# Patient Record
Sex: Female | Born: 1995 | Race: White | Hispanic: No | Marital: Married | Smoking: Never smoker
Health system: Southern US, Community
[De-identification: ages and names within clinical notes are randomized; demographics above are authoritative.]

## PROBLEM LIST (undated history)

## (undated) DIAGNOSIS — O24419 Gestational diabetes mellitus in pregnancy, unspecified control: Secondary | ICD-10-CM

## (undated) DIAGNOSIS — K469 Unspecified abdominal hernia without obstruction or gangrene: Secondary | ICD-10-CM

## (undated) HISTORY — DX: Unspecified abdominal hernia without obstruction or gangrene: K46.9

## (undated) HISTORY — PX: TYMPANOSTOMY TUBE PLACEMENT: SHX32

## (undated) HISTORY — DX: Gestational diabetes mellitus in pregnancy, unspecified control: O24.419

## (undated) HISTORY — PX: ADENOIDECTOMY: SUR15

---

## 2017-07-17 ENCOUNTER — Other Ambulatory Visit: Payer: Self-pay

## 2017-07-17 ENCOUNTER — Encounter: Payer: Self-pay | Admitting: Obstetrics and Gynecology

## 2017-07-17 ENCOUNTER — Other Ambulatory Visit (HOSPITAL_COMMUNITY)
Admission: RE | Admit: 2017-07-17 | Discharge: 2017-07-17 | Disposition: A | Discharge status: Home / Self Care | Payer: BC Managed Care – PPO | Source: Ambulatory Visit | Attending: Obstetrics and Gynecology | Admitting: Obstetrics and Gynecology

## 2017-07-17 ENCOUNTER — Ambulatory Visit: Payer: BC Managed Care – PPO | Admitting: Obstetrics and Gynecology

## 2017-07-17 VITALS — BP 124/78 | HR 80 | Resp 12 | Ht 65.75 in | Wt 153.0 lb

## 2017-07-17 DIAGNOSIS — Z124 Encounter for screening for malignant neoplasm of cervix: Secondary | ICD-10-CM | POA: Diagnosis not present

## 2017-07-17 DIAGNOSIS — Z7189 Other specified counseling: Secondary | ICD-10-CM

## 2017-07-17 DIAGNOSIS — Z01419 Encounter for gynecological examination (general) (routine) without abnormal findings: Secondary | ICD-10-CM

## 2017-07-17 DIAGNOSIS — N946 Dysmenorrhea, unspecified: Secondary | ICD-10-CM

## 2017-07-17 DIAGNOSIS — Z3041 Encounter for surveillance of contraceptive pills: Secondary | ICD-10-CM

## 2017-07-17 DIAGNOSIS — Z7185 Encounter for immunization safety counseling: Secondary | ICD-10-CM

## 2017-07-17 DIAGNOSIS — Z23 Encounter for immunization: Secondary | ICD-10-CM

## 2017-07-17 MED ORDER — SPRINTEC 28 0.25-35 MG-MCG PO TABS: 1.0000 | ORAL_TABLET | Freq: Every day | ORAL | 3 refills | Status: DC

## 2017-07-17 MED ORDER — NAPROXEN SODIUM 550 MG PO TABS: 550.0000 mg | ORAL_TABLET | Freq: Two times a day (BID) | ORAL | 1 refills | Status: DC

## 2017-07-17 NOTE — Patient Instructions (Addendum)
Breast Self-Awareness Breast self-awareness means being familiar with how your breasts look and feel. It involves checking your breasts regularly and reporting any changes to your health care provider. Practicing breast self-awareness is important. A change in your breasts can be a sign of a serious medical problem. Being familiar with how your breasts look and feel allows you to find any problems early, when treatment is more likely to be successful. All women should practice breast self-awareness, including women who have had breast implants. How to do a breast self-exam One way to learn what is normal for your breasts and whether your breasts are changing is to do a breast self-exam. To do a breast self-exam: Look for Changes  1. Remove all the clothing above your waist. 2. Stand in front of a mirror in a room with good lighting. 3. Put your hands on your hips. 4. Push your hands firmly downward. 5. Compare your breasts in the mirror. Look for differences between them (asymmetry), such as: ? Differences in shape. ? Differences in size. ? Puckers, dips, and bumps in one breast and not the other. 6. Look at each breast for changes in your skin, such as: ? Redness. ? Scaly areas. 7. Look for changes in your nipples, such as: ? Discharge. ? Bleeding. ? Dimpling. ? Redness. ? A change in position. Feel for Changes  Carefully feel your breasts for lumps and changes. It is best to do this while lying on your back on the floor and again while sitting or standing in the shower or tub with soapy water on your skin. Feel each breast in the following way:  Place the arm on the side of the breast you are examining above your head.  Feel your breast with the other hand.  Start in the nipple area and make  inch (2 cm) overlapping circles to feel your breast. Use the pads of your three middle fingers to do this. Apply light pressure, then medium pressure, then firm pressure. The light pressure  will allow you to feel the tissue closest to the skin. The medium pressure will allow you to feel the tissue that is a little deeper. The firm pressure will allow you to feel the tissue close to the ribs.  Continue the overlapping circles, moving downward over the breast until you feel your ribs below your breast.  Move one finger-width toward the center of the body. Continue to use the  inch (2 cm) overlapping circles to feel your breast as you move slowly up toward your collarbone.  Continue the up and down exam using all three pressures until you reach your armpit.  Write Down What You Find  Write down what is normal for each breast and any changes that you find. Keep a written record with breast changes or normal findings for each breast. By writing this information down, you do not need to depend only on memory for size, tenderness, or location. Write down where you are in your menstrual cycle, if you are still menstruating. If you are having trouble noticing differences in your breasts, do not get discouraged. With time you will become more familiar with the variations in your breasts and more comfortable with the exam. How often should I examine my breasts? Examine your breasts every month. If you are breastfeeding, the best time to examine your breasts is after a feeding or after using a breast pump. If you menstruate, the best time to examine your breasts is 5-7 days after your  period is over. During your period, your breasts are lumpier, and it may be more difficult to notice changes. When should I see my health care provider? See your health care provider if you notice:  A change in shape or size of your breasts or nipples.  A change in the skin of your breast or nipples, such as a reddened or scaly area.  Unusual discharge from your nipples.  A lump or thick area that was not there before.  Pain in your breasts.  Anything that concerns you.  This information is not intended  to replace advice given to you by your health care provider. Make sure you discuss any questions you have with your health care provider. Document Released: 06/19/2005 Document Revised: 11/25/2015 Document Reviewed: 05/09/2015 Elsevier Interactive Patient Education  2018 [employer] Oral Contraception Information Oral contraceptive pills (OCPs) are medicines taken to prevent pregnancy. OCPs work by preventing the ovaries from releasing eggs. The hormones in OCPs also cause the cervical mucus to thicken, preventing the sperm from entering the uterus. The hormones also cause the uterine lining to become thin, not allowing a fertilized egg to attach to the inside of the uterus. OCPs are highly effective when taken exactly as prescribed. However, OCPs do not prevent sexually transmitted diseases (STDs). Safe sex practices, such as using condoms along with the pill, can help prevent STDs. Before taking the pill, you may have a physical exam and Pap test. Your health care provider may order blood tests. The health care provider will make sure you are a good candidate for oral contraception. Discuss with your health care provider the possible side effects of the OCP you may be prescribed. When starting an OCP, it can take 2 to 3 months for the body to adjust to the changes in hormone levels in your body. Types of oral contraception  The combination pill-This pill contains estrogen and progestin (synthetic progesterone) hormones. The combination pill comes in 21-day, 28-day, or 91-day packs. Some types of combination pills are meant to be taken continuously (365-day pills). With 21-day packs, you do not take pills for 7 days after the last pill. With 28-day packs, the pill is taken every day. The last 7 pills are without hormones. Certain types of pills have more than 21 hormone-containing pills. With 91-day packs, the first 84 pills contain both hormones, and the last 7 pills contain no hormones or contain  estrogen only.  The minipill-This pill contains the progesterone hormone only. The pill is taken every day continuously. It is very important to take the pill at the same time each day. The minipill comes in packs of 28 pills. All 28 pills contain the hormone. Advantages of oral contraceptive pills  Decreases premenstrual symptoms.  Treats menstrual period cramps.  Regulates the menstrual cycle.  Decreases a heavy menstrual flow.  May treatacne, depending on the type of pill.  Treats abnormal uterine bleeding.  Treats polycystic ovarian syndrome.  Treats endometriosis.  Can be used as emergency contraception. Things that can make oral contraceptive pills less effective OCPs can be less effective if:  You forget to take the pill at the same time every day.  You have a stomach or intestinal disease that lessens the absorption of the pill.  You take OCPs with other medicines that make OCPs less effective, such as antibiotics, certain HIV medicines, and some seizure medicines.  You take expired OCPs.  You forget to restart the pill on day 7, when using the packs of  21 pills.  Risks associated with oral contraceptive pills Oral contraceptive pills can sometimes cause side effects, such as:  Headache.  Nausea.  Breast tenderness.  Irregular bleeding or spotting.  Combination pills are also associated with a small increased risk of:  Blood clots.  Heart attack.  Stroke.  This information is not intended to replace advice given to you by your health care provider. Make sure you discuss any questions you have with your health care provider. Document Released: 09/09/2002 Document Revised: 11/25/2015 Document Reviewed: 12/08/2012 Elsevier Interactive Patient Education  2018 [employer]  EXERCISE AND DIET:  We recommended that you start or continue a regular exercise program for good health. Regular exercise means any activity that makes your heart beat faster and  makes you sweat.  We recommend exercising at least 30 minutes per day at least 3 days a week, preferably 4 or 5.  We also recommend a diet low in fat and sugar.  Inactivity, poor dietary choices and obesity can cause diabetes, heart attack, stroke, and kidney damage, among others.    ALCOHOL AND SMOKING:  Women should limit their alcohol intake to no more than 7 drinks/beers/glasses of wine (combined, not each!) per week. Moderation of alcohol intake to this level decreases your risk of breast cancer and liver damage. And of course, no recreational drugs are part of a healthy lifestyle.  And absolutely no smoking or even second hand smoke. Most people know smoking can cause heart and lung diseases, but did you know it also contributes to weakening of your bones? Aging of your skin?  Yellowing of your teeth and nails?  CALCIUM AND VITAMIN D:  Adequate intake of calcium and Vitamin D are recommended.  The recommendations for exact amounts of these supplements seem to change often, but generally speaking 600 mg of calcium (either carbonate or citrate) and 800 units of Vitamin D per day seems prudent. Certain women may benefit from higher intake of Vitamin D.  If you are among these women, your doctor will have told you during your visit.    PAP SMEARS:  Pap smears, to check for cervical cancer or precancers,  have traditionally been done yearly, although recent scientific advances have shown that most women can have pap smears less often.  However, every woman still should have a physical exam from her gynecologist every year. It will include a breast check, inspection of the vulva and vagina to check for abnormal growths or skin changes, a visual exam of the cervix, and then an exam to evaluate the size and shape of the uterus and ovaries.  And after 23 years of age, a rectal exam is indicated to check for rectal cancers. We will also provide age appropriate advice regarding health maintenance, like when you  should have certain vaccines, screening for sexually transmitted diseases, bone density testing, colonoscopy, mammograms, etc.

## 2017-07-17 NOTE — Progress Notes (Signed)
22 y.o. G0P0000 SingleCaucasianF here for annual exam.  She is living with her partner of 4 years. She moved here a year ago, loves it here. No dyspareunia.  Period Cycle (Days): 28 Period Duration (Days): 5 - 6 days  Period Pattern: Regular Menstrual Flow: Moderate Menstrual Control: Tampon Menstrual Control Change Freq (Hours): changes tampon every 3-4 hours  Dysmenorrhea: (!) Moderate Dysmenorrhea Symptoms: Cramping  No LMP recorded.          Sexually active: Yes.    The current method of family planning is OCP (estrogen/progesterone).    Exercising: Yes.    cardio/ lifting/swimming Smoker:  no  Health Maintenance: Pap:  Never TDaP:  2014  Gardasil: no    reports that  has never smoked. she has never used smokeless tobacco. She reports that she drinks about 1.2 oz of alcohol per week. She reports that she does not use drugs. 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   Past Medical History:  Diagnosis Date  . Hernia of abdominal cavity   Has a hernia above her umbilicus. Occasional discomfort in that area.   Past Surgical History:  Procedure Laterality Date  . ADENOIDECTOMY    . TYMPANOSTOMY TUBE PLACEMENT      Current Outpatient Medications  Medication Sig Dispense Refill  . SPRINTEC 28 0.25-35 MG-MCG tablet      No current facility-administered medications for this visit.     History reviewed. No pertinent family history.  Review of Systems  Constitutional: Negative.   HENT: Negative.   Eyes: Negative.   Respiratory: Negative.   Cardiovascular: Negative.   Gastrointestinal: Negative.   Endocrine: Negative.   Genitourinary: Negative.   Musculoskeletal: Negative.   Skin: Negative.   Allergic/Immunologic: Negative.   Neurological: Negative.   Psychiatric/Behavioral: Negative.     Exam:   BP 124/78 (BP Location: Right Arm, Patient Position: Sitting, Cuff Size: Normal)   Pulse 80   Resp 12   Ht 5' 5.75" (1.67 m)   Wt 153 lb (69.4 kg)    BMI 24.88 kg/m   Weight change: @WEIGHTCHANGE @ Height:   Height: 5' 5.75" (167 cm)  Ht Readings from Last 3 Encounters:  07/17/17 5' 5.75" (1.67 m)    General appearance: alert, cooperative and appears stated age Head: Normocephalic, without obvious abnormality, atraumatic Neck: no adenopathy, supple, symmetrical, trachea midline and thyroid normal to inspection and palpation Lungs: clear to auscultation bilaterally Cardiovascular: regular rate and rhythm Breasts: normal appearance, no masses or tenderness, accesorry nipple on the right Abdomen: soft, non-tender; non distended,  no masses,  no organomegaly. Just above the umbilicus is a very slight separation of the fascia.  Extremities: extremities normal, atraumatic, no cyanosis or edema Skin: Skin color, texture, turgor normal. No rashes or lesions Lymph nodes: Cervical, supraclavicular, and axillary nodes normal. No abnormal inguinal nodes palpated Neurologic: Grossly normal   Pelvic: External genitalia:  no lesions              Urethra:  normal appearing urethra with no masses, tenderness or lesions              Bartholins and Skenes: normal                 Vagina: normal appearing vagina with normal color and discharge, no lesions              Cervix: no lesions  Bimanual Exam:  Uterus:  normal size, contour, position, consistency, mobility, non-tender and retroverted              Adnexa: no mass, fullness, tenderness               Rectovaginal: Confirms               Anus:  normal sphincter tone, no lesions  Chaperone was present for exam.  A:  Well Woman with normal exam  Dysmenorrhea  On OCP's, wants to continue, discussed risks. No contraindications  P:   Continue sprintec  Anaprox for cramps  H/O hernia, not bothering her, very small, no treatment needed  Pap with GC/CT  Declines blood work  Discussed gardasil, she wants to start the series.   Discussed breast self exam  Discussed calcium and vit D  intake

## 2017-07-18 LAB — CYTOLOGY - PAP
Chlamydia: NEGATIVE
Diagnosis: NEGATIVE
Neisseria Gonorrhea: NEGATIVE

## 2017-07-25 ENCOUNTER — Telehealth: Payer: BC Managed Care – PPO | Admitting: Family

## 2017-07-25 DIAGNOSIS — J329 Chronic sinusitis, unspecified: Secondary | ICD-10-CM

## 2017-07-25 DIAGNOSIS — H539 Unspecified visual disturbance: Secondary | ICD-10-CM

## 2017-07-25 DIAGNOSIS — R0602 Shortness of breath: Secondary | ICD-10-CM

## 2017-07-25 NOTE — Progress Notes (Signed)
Based on what you shared with me it looks like you have a serious condition that should be evaluated in a face to face office visit.  NOTE: If you entered your credit card information for this eVisit, you will not be charged. You may see a "hold" on your card for the $30 but that hold will drop off and you will not have a charge processed.  If you are having a true medical emergency please call 911.  If you need an urgent face to face visit, [facility] has four urgent care centers for your convenience.  If you need care fast and have a high deductible or no insurance consider:   [email or web address] to reserve your spot online an avoid wait times  [facility] [address], [address] [place], [place] [address] 8 am to 8 pm Monday-Friday 10 am to 4 pm Saturday-Sunday *Across the street from [employer]  [facility]  [address] [place], [address] 8 am to 5 pm Monday-Friday * In the [facility]   The following sites will take your  insurance:  . [facility]  [phone] Get Driving Directions Find a Provider at this Location  [address] [place], [place] [address] . 10 am to 8 pm Monday-Friday . 12 pm to 8 pm Saturday-Sunday   . [facility]  [phone] Get Driving Directions Find a Provider at this Location  [address], [address] [place], [place] [address] . 8 am to 8 pm Monday-Friday . 9 am to 6 pm Saturday . 11 am to 6 pm Sunday   . [facility] [place]  [phone] Get Driving Directions  [address] [place], [place] [address] . 8 am to 8 pm Monday-Friday . 8 am to 4 pm Saturday-Sunday   Your e-visit answers were reviewed by a board certified advanced clinical practitioner to complete your personal care plan.  Thank you for using e-Visits.  

## 2017-09-13 ENCOUNTER — Ambulatory Visit: Payer: BC Managed Care – PPO

## 2017-09-14 ENCOUNTER — Ambulatory Visit: Payer: BC Managed Care – PPO

## 2017-09-14 ENCOUNTER — Ambulatory Visit (INDEPENDENT_AMBULATORY_CARE_PROVIDER_SITE_OTHER): Payer: BC Managed Care – PPO

## 2017-09-14 VITALS — BP 104/72 | HR 68 | Resp 16 | Ht 65.75 in | Wt 153.0 lb

## 2017-09-14 DIAGNOSIS — Z23 Encounter for immunization: Secondary | ICD-10-CM | POA: Diagnosis not present

## 2017-09-14 NOTE — Progress Notes (Signed)
Pt here for 2nd gardasil. Pt tolerated injection well. Pt aware to rto in 4 mths for 3rd gardasil

## 2018-01-14 ENCOUNTER — Ambulatory Visit (INDEPENDENT_AMBULATORY_CARE_PROVIDER_SITE_OTHER): Payer: BC Managed Care – PPO

## 2018-01-14 VITALS — BP 110/62 | HR 64 | Resp 14 | Ht 65.75 in | Wt 143.0 lb

## 2018-01-14 DIAGNOSIS — Z23 Encounter for immunization: Secondary | ICD-10-CM | POA: Diagnosis not present

## 2018-01-14 NOTE — Progress Notes (Signed)
Patient in today for 3rd Gardasil injection.   Contraception: OCP LMP: 01/11/18 Last AEX: 07/17/17  with Dr. Oscar LaJertson  Injection given in Left Deltoid. Patient tolerated shot well.   Patient informed She is finished with series   Advised patient, if not on birth control, to return for next injection with cycle.   Routed to provider for final review.  Encounter closed.

## 2018-07-18 ENCOUNTER — Ambulatory Visit: Payer: BC Managed Care – PPO | Admitting: Obstetrics and Gynecology

## 2018-10-09 ENCOUNTER — Ambulatory Visit: Payer: BC Managed Care – PPO | Admitting: Obstetrics and Gynecology

## 2019-11-07 NOTE — Progress Notes (Signed)
24 y.o. G0P0000 married White or Caucasian Not Hispanic or Latino female here for annual exam.   Got married in 8/20. Just went off OCP's at the end of March. Started PNV Period Cycle (Days): 28 Period Duration (Days): 6 Menstrual Flow: Moderate Menstrual Control: Tampon Menstrual Control Change Freq (Hours): 2 x a day Dysmenorrhea: (!) Moderate  Cramps helped with anaprox. Uses regular tampons.  No family history of genetic or chromosomal abnormalities on either side. Neither of them have a father.  H/O chicken pox.  Patient's last menstrual period was 11/01/2019 (approximate).          Sexually active: Yes.    The current method of family planning is none.    Exercising: Yes.    Cardio, Weights  Smoker:  no  Health Maintenance: Pap:  07/17/17 neg History of abnormal Pap:  no TDaP:  2014 Gardasil: Complete    reports that she has never smoked. She has never used smokeless tobacco. She reports current alcohol use of about 2.0 standard drinks of alcohol per week. She reports that she does not use drugs. Teacher, 4th grade.  Past Medical History:  Diagnosis Date  . Hernia of abdominal cavity     Past Surgical History:  Procedure Laterality Date  . ADENOIDECTOMY    . TYMPANOSTOMY TUBE PLACEMENT      Current Outpatient Medications  Medication Sig Dispense Refill  . naproxen sodium (ANAPROX DS) 550 MG tablet Take 1 tablet (550 mg total) by mouth 2 (two) times daily with a meal. 30 tablet 1   No current facility-administered medications for this visit.    History reviewed. No pertinent family history.  Review of Systems  Constitutional: Negative.   HENT: Negative.   Eyes: Negative.   Respiratory: Negative.   Cardiovascular: Negative.   Gastrointestinal: Negative.   Endocrine: Negative.   Genitourinary: Negative.   Musculoskeletal: Negative.   Skin: Negative.   Allergic/Immunologic: Negative.   Neurological: Negative.   Hematological: Negative.    Psychiatric/Behavioral: Negative.     Exam:   BP 128/64   Pulse 67   Temp 98.9 F (37.2 C)   Ht 5\' 6"  (1.676 m)   Wt 150 lb (68 kg)   LMP 11/01/2019 (Approximate)   SpO2 99%   BMI 24.21 kg/m   Weight change: @WEIGHTCHANGE @ Height:   Height: 5\' 6"  (167.6 cm)  Ht Readings from Last 3 Encounters:  11/10/19 5\' 6"  (1.676 m)  01/14/18 5' 5.75" (1.67 m)  09/14/17 5' 5.75" (1.67 m)    General appearance: alert, cooperative and appears stated age Head: Normocephalic, without obvious abnormality, atraumatic Neck: no adenopathy, supple, symmetrical, trachea midline and thyroid normal to inspection and palpation Lungs: clear to auscultation bilaterally Cardiovascular: regular rate and rhythm Breasts: normal appearance, no masses or tenderness Abdomen: soft, non-tender; non distended,  no masses,  no organomegaly Extremities: extremities normal, atraumatic, no cyanosis or edema Skin: Skin color, texture, turgor normal. No rashes or lesions Lymph nodes: Cervical, supraclavicular, and axillary nodes normal. No abnormal inguinal nodes palpated Neurologic: Grossly normal   Pelvic: External genitalia:  no lesions              Urethra:  normal appearing urethra with no masses, tenderness or lesions              Bartholins and Skenes: normal                 Vagina: normal appearing vagina with normal color and discharge, no lesions  Cervix: no lesions               Bimanual Exam:  Uterus:  normal size, contour, position, consistency, mobility, non-tender and anteverted              Adnexa: no mass, fullness, tenderness               Rectovaginal: Confirms               Anus:  normal sphincter tone, no lesions  Gae Dry chaperoned for the exam.  A:  Well Woman with normal exam  Planning conception  P:   No pap this year  Screening labs, Rubella AB  Discussed the Covid vaccination  She is on PNV  Discussed breast self exam  Discussed calcium and vit D intake

## 2019-11-10 ENCOUNTER — Encounter: Payer: Self-pay | Admitting: Obstetrics and Gynecology

## 2019-11-10 ENCOUNTER — Ambulatory Visit: Payer: BC Managed Care – PPO | Admitting: Obstetrics and Gynecology

## 2019-11-10 ENCOUNTER — Other Ambulatory Visit: Payer: Self-pay

## 2019-11-10 VITALS — BP 128/64 | HR 67 | Temp 98.9°F | Ht 66.0 in | Wt 150.0 lb

## 2019-11-10 DIAGNOSIS — Z01419 Encounter for gynecological examination (general) (routine) without abnormal findings: Secondary | ICD-10-CM | POA: Diagnosis not present

## 2019-11-10 DIAGNOSIS — Z3169 Encounter for other general counseling and advice on procreation: Secondary | ICD-10-CM | POA: Diagnosis not present

## 2019-11-10 DIAGNOSIS — Z0000 Encounter for general adult medical examination without abnormal findings: Secondary | ICD-10-CM | POA: Diagnosis not present

## 2019-11-10 NOTE — Patient Instructions (Signed)
EXERCISE AND DIET:  We recommended that you start or continue a regular exercise program for good health. Regular exercise means any activity that makes your heart beat faster and makes you sweat.  We recommend exercising at least 30 minutes per day at least 3 days a week, preferably 4 or 5.  We also recommend a diet low in fat and sugar.  Inactivity, poor dietary choices and obesity can cause diabetes, heart attack, stroke, and kidney damage, among others.    ALCOHOL AND SMOKING:  Women should limit their alcohol intake to no more than 7 drinks/beers/glasses of wine (combined, not each!) per week. Moderation of alcohol intake to this level decreases your risk of breast cancer and liver damage. And of course, no recreational drugs are part of a healthy lifestyle.  And absolutely no smoking or even second hand smoke. Most people know smoking can cause heart and lung diseases, but did you know it also contributes to weakening of your bones? Aging of your skin?  Yellowing of your teeth and nails?  CALCIUM AND VITAMIN D:  Adequate intake of calcium and Vitamin D are recommended.  The recommendations for exact amounts of these supplements seem to change often, but generally speaking 1,000 mg of calcium (between diet and supplement) and 800 units of Vitamin D per day seems prudent. Certain women may benefit from higher intake of Vitamin D.  If you are among these women, your doctor will have told you during your visit.    PAP SMEARS:  Pap smears, to check for cervical cancer or precancers,  have traditionally been done yearly, although recent scientific advances have shown that most women can have pap smears less often.  However, every woman still should have a physical exam from her gynecologist every year. It will include a breast check, inspection of the vulva and vagina to check for abnormal growths or skin changes, a visual exam of the cervix, and then an exam to evaluate the size and shape of the uterus and  ovaries.  And after 25 years of age, a rectal exam is indicated to check for rectal cancers. We will also provide age appropriate advice regarding health maintenance, like when you should have certain vaccines, screening for sexually transmitted diseases, bone density testing, colonoscopy, mammograms, etc.   MAMMOGRAMS:  All women over 75 years old should have a yearly mammogram. Many facilities now offer a "3D" mammogram, which may cost around $50 extra out of pocket. If possible,  we recommend you accept the option to have the 3D mammogram performed.  It both reduces the number of women who will be called back for extra views which then turn out to be normal, and it is better than the routine mammogram at detecting truly abnormal areas.    COLON CANCER SCREENING: Now recommend starting at age 57. At this time colonoscopy is not covered for routine screening until 50. There are take home tests that can be done between 45-49.   COLONOSCOPY:  Colonoscopy to screen for colon cancer is recommended for all women at age 91.  We know, you hate the idea of the prep.  We agree, BUT, having colon cancer and not knowing it is worse!!  Colon cancer so often starts as a polyp that can be seen and removed at colonscopy, which can quite literally save your life!  And if your first colonoscopy is normal and you have no family history of colon cancer, most women don't have to have it again for  10 years.  Once every ten years, you can do something that may end up saving your life, right?  We will be happy to help you get it scheduled when you are ready.  Be sure to check your insurance coverage so you understand how much it will cost.  It may be covered as a preventative service at no cost, but you should check your particular policy.   ° ° ° °Breast Self-Awareness °Breast self-awareness means being familiar with how your breasts look and feel. It involves checking your breasts regularly and reporting any changes to your  health care provider. °Practicing breast self-awareness is important. A change in your breasts can be a sign of a serious medical problem. Being familiar with how your breasts look and feel allows you to find any problems early, when treatment is more likely to be successful. All women should practice breast self-awareness, including women who have had breast implants. °How to do a breast self-exam °One way to learn what is normal for your breasts and whether your breasts are changing is to do a breast self-exam. To do a breast self-exam: °Look for Changes ° °1. Remove all the clothing above your waist. °2. Stand in front of a mirror in a room with good lighting. °3. Put your hands on your hips. °4. Push your hands firmly downward. °5. Compare your breasts in the mirror. Look for differences between them (asymmetry), such as: °? Differences in shape. °? Differences in size. °? Puckers, dips, and bumps in one breast and not the other. °6. Look at each breast for changes in your skin, such as: °? Redness. °? Scaly areas. °7. Look for changes in your nipples, such as: °? Discharge. °? Bleeding. °? Dimpling. °? Redness. °? A change in position. °Feel for Changes °Carefully feel your breasts for lumps and changes. It is best to do this while lying on your back on the floor and again while sitting or standing in the shower or tub with soapy water on your skin. Feel each breast in the following way: °· Place the arm on the side of the breast you are examining above your head. °· Feel your breast with the other hand. °· Start in the nipple area and make ¾ inch (2 cm) overlapping circles to feel your breast. Use the pads of your three middle fingers to do this. Apply light pressure, then medium pressure, then firm pressure. The light pressure will allow you to feel the tissue closest to the skin. The medium pressure will allow you to feel the tissue that is a little deeper. The firm pressure will allow you to feel the tissue  close to the ribs. °· Continue the overlapping circles, moving downward over the breast until you feel your ribs below your breast. °· Move one finger-width toward the center of the body. Continue to use the ¾ inch (2 cm) overlapping circles to feel your breast as you move slowly up toward your collarbone. °· Continue the up and down exam using all three pressures until you reach your armpit. ° °Write Down What You Find ° °Write down what is normal for each breast and any changes that you find. Keep a written record with breast changes or normal findings for each breast. By writing this information down, you do not need to depend only on memory for size, tenderness, or location. Write down where you are in your menstrual cycle, if you are still menstruating. °If you are having trouble noticing differences   in your breasts, do not get discouraged. With time you will become more familiar with the variations in your breasts and more comfortable with the exam. How often should I examine my breasts? Examine your breasts every month. If you are breastfeeding, the best time to examine your breasts is after a feeding or after using a breast pump. If you menstruate, the best time to examine your breasts is 5-7 days after your period is over. During your period, your breasts are lumpier, and it may be more difficult to notice changes. When should I see my health care provider? See your health care provider if you notice:  A change in shape or size of your breasts or nipples.  A change in the skin of your breast or nipples, such as a reddened or scaly area.  Unusual discharge from your nipples.  A lump or thick area that was not there before.  Pain in your breasts.  Anything that concerns you. Commonly Asked Questions During Pregnancy  Cats: A parasite can be excreted in cat feces.  To avoid exposure you need to have another person empty the little box.  If you must empty the litter box you will need to wear  gloves.  Wash your hands after handling your cat.  This parasite can also be found in raw or undercooked meat so this should also be avoided.  Colds, Sore Throats, Flu: Please check your medication sheet to see what you can take for symptoms.  If your symptoms are unrelieved by these medications please call the office.  Dental Work: Most any dental work Investment banker, corporate recommends is permitted.  X-rays should only be taken during the first trimester if absolutely necessary.  Your abdomen should be shielded with a lead apron during all x-rays.  Please notify your provider prior to receiving any x-rays.  Novocaine is fine; gas is not recommended.  If your dentist requires a note from [place] prior to dental work please call the office and we will provide one for you.  Exercise: Exercise is an important part of staying healthy during your pregnancy.  You may continue most exercises you were accustomed to prior to pregnancy.  Later in your pregnancy you will most likely notice you have difficulty with activities requiring balance like riding a bicycle.  It is important that you listen to your body and avoid activities that put you at a higher risk of falling.  Adequate rest and staying well hydrated are a must!  If you have questions about the safety of specific activities ask your provider.    Exposure to Children with illness: Try to avoid obvious exposure; report any symptoms to [place] when noted,  If you have chicken pos, red measles or mumps, you should be immune to these diseases.   Please do not take any vaccines while pregnant unless you have checked with your OB provider.  Fetal Movement: After 28 weeks we recommend you do "kick counts" twice daily.  Lie or sit down in a calm quiet environment and count your baby movements "kicks".  You should feel your baby at least 10 times per hour.  If you have not felt 10 kicks within the first hour get up, walk around and have something sweet to eat or drink then repeat for  an additional hour.  If count remains less than 10 per hour notify your provider.  Fumigating: Follow your pest control agent's advice as to how long to stay out of your home.  Ventilate  the area well before re-entering.  Hemorrhoids:   Most over-the-counter preparations can be used during pregnancy.  Check your medication to see what is safe to use.  It is important to use a stool softener or fiber in your diet and to drink lots of liquids.  If hemorrhoids seem to be getting worse please call the office.   Hot Tubs:  Hot tubs Jacuzzis and saunas are not recommended while pregnant.  These increase your internal body temperature and should be avoided.  Intercourse:  Sexual intercourse is safe during pregnancy as long as you are comfortable, unless otherwise advised by your provider.  Spotting may occur after intercourse; report any bright red bleeding that is heavier than spotting.  Labor:  If you know that you are in labor, please go to the hospital.  If you are unsure, please call the office and let us help you decide what to do.  Lifting, straining, etc:  If your job requires heavy lifting or straining please check with your provider for any limitations.  Generally, you should not lift items heavier than that you can lift simply with your hands and arms (no back muscles)  Painting:  Paint fumes do not harm your pregnancy, but may make you ill and should be avoided if possible.  Latex or water based paints have less odor than oils.  Use adequate ventilation while painting.  Permanents & Hair Color:  Chemicals in hair dyes are not recommended as they cause increase hair dryness which can increase hair loss during pregnancy.  " Highlighting" and permanents are allowed.  Dye may be absorbed differently and permanents may not hold as well during pregnancy.  Sunbathing:  Use a sunscreen, as skin burns easily during pregnancy.  Drink plenty of fluids; avoid over heating.  Tanning Beds:  Because their  possible side effects are still unknown, tanning beds are not recommended.  Ultrasound Scans:  Routine ultrasounds are performed at approximately 20 weeks.  You will be able to see your baby's general anatomy an if you would like to know the gender this can usually be determined as well.  If it is questionable when you conceived you may also receive an ultrasound early in your pregnancy for dating purposes.  Otherwise ultrasound exams are not routinely performed unless there is a medical necessity.  Although you can request a scan we ask that you pay for it when conducted because insurance does not cover " patient request" scans.  Work: If your pregnancy proceeds without complications you may work until your due date, unless your physician or employer advises otherwise.  Round Ligament Pain/Pelvic Discomfort:  Sharp, shooting pains not associated with bleeding are fairly common, usually occurring in the second trimester of pregnancy.  They tend to be worse when standing up or when you remain standing for long periods of time.  These are the result of pressure of certain pelvic ligaments called "round ligaments".  Rest, Tylenol and heat seem to be the most effective relief.  As the womb and fetus grow, they rise out of the pelvis and the discomfort improves.  Please notify the office if your pain seems different than that described.  It may represent a more serious condition.  Common Medications Safe in Pregnancy  Acne:      Constipation:  Benzoyl Peroxide     Colace  Clindamycin      Dulcolax Suppository  Topica Erythromycin     Fibercon  Salicylic Acid      Metamucil  Miralax AVOID:        Senakot   Accutane    Cough:  Retin-A       Cough Drops  Tetracycline      Phenergan w/ Codeine if Rx  Minocycline      Robitussin (Plain &  DM)  Antibiotics:     Crabs/Lice:  Ceclor       RID  Cephalosporins    AVOID:  E-Mycins      Kwell  Keflex  Macrobid/Macrodantin   Diarrhea:  Penicillin      Kao-Pectate  Zithromax      Imodium AD         PUSH FLUIDS AVOID:       Cipro     Fever:  Tetracycline      Tylenol (Regular or Extra  Minocycline       Strength)  Levaquin      Extra Strength-Do not          Exceed 8 tabs/24 hrs Caffeine:        '200mg'$ /day (equiv. To 1 cup of coffee or  approx. 3 12 oz sodas)         Gas: Cold/Hayfever:       Gas-X  Benadryl      Mylicon  Claritin       Phazyme  **Claritin-D        Chlor-Trimeton    Headaches:  Dimetapp      ASA-Free Excedrin  Drixoral-Non-Drowsy     Cold Compress  Mucinex (Guaifenasin)     Tylenol (Regular or Extra  Sudafed/Sudafed-12 Hour     Strength)  **Sudafed PE Pseudoephedrine   Tylenol Cold & Sinus     Vicks Vapor Rub  Zyrtec  **AVOID if Problems With Blood Pressure         Heartburn: Avoid lying down for at least 1 hour after meals  Aciphex      Maalox     Rash:  Milk of Magnesia     Benadryl    Mylanta       1% Hydrocortisone Cream  Pepcid  Pepcid Complete   Sleep Aids:  Prevacid      Ambien   Prilosec       Benadryl  Rolaids       Chamomile Tea  Tums (Limit 4/day)     Unisom  Zantac       Tylenol PM         Warm milk-add vanilla or  Hemorrhoids:       Sugar for taste  Anusol/Anusol H.C.  (RX: Analapram 2.5%)  Sugar Substitutes:  Hydrocortisone OTC     Ok in moderation  Preparation H      Tucks        Vaseline lotion applied to tissue with wiping    Herpes:     Throat:  Acyclovir      Oragel  Famvir  Valtrex     Vaccines:         Flu Shot Leg Cramps:       *Gardasil  Benadryl      Hepatitis A         Hepatitis B Nasal Spray:       Pneumovax  Saline Nasal Spray     Polio Booster         Tetanus Nausea:       Tuberculosis test or PPD  Vitamin B6 25 mg TID   AVOID:    Dramamine      *  Gardasil  Emetrol       Live  Poliovirus  Ginger Root 250 mg QID    MMR (measles, mumps &  High Complex Carbs @ Bedtime    rebella)  Sea Bands-Accupressure    Varicella (Chickenpox)  Unisom 1/2 tab TID     *No known complications           If received before Pain:         Known pregnancy;   Darvocet       Resume series after  Lortab        Delivery  Percocet    Yeast:   Tramadol      Femstat  Tylenol 3      Gyne-lotrimin  Ultram       Monistat  Vicodin           MISC:         All Sunscreens           Hair Coloring/highlights          Insect Repellant's          (Including DEET)         Mystic Tans

## 2019-11-11 LAB — CBC
Hematocrit: 39.9 % (ref 34.0–46.6)
Hemoglobin: 13.4 g/dL (ref 11.1–15.9)
MCH: 29.8 pg (ref 26.6–33.0)
MCHC: 33.6 g/dL (ref 31.5–35.7)
MCV: 89 fL (ref 79–97)
Platelets: 268 10*3/uL (ref 150–450)
RBC: 4.5 x10E6/uL (ref 3.77–5.28)
RDW: 12.5 % (ref 11.7–15.4)
WBC: 5.7 10*3/uL (ref 3.4–10.8)

## 2019-11-11 LAB — COMPREHENSIVE METABOLIC PANEL
ALT: 26 IU/L (ref 0–32)
AST: 25 IU/L (ref 0–40)
Albumin/Globulin Ratio: 2 (ref 1.2–2.2)
Albumin: 4.7 g/dL (ref 3.9–5.0)
Alkaline Phosphatase: 45 IU/L (ref 39–117)
BUN/Creatinine Ratio: 15 (ref 9–23)
BUN: 13 mg/dL (ref 6–20)
Bilirubin Total: 1.2 mg/dL (ref 0.0–1.2)
CO2: 20 mmol/L (ref 20–29)
Calcium: 9.6 mg/dL (ref 8.7–10.2)
Chloride: 103 mmol/L (ref 96–106)
Creatinine, Ser: 0.87 mg/dL (ref 0.57–1.00)
GFR calc Af Amer: 107 mL/min/{1.73_m2} (ref >59)
GFR calc non Af Amer: 93 mL/min/{1.73_m2} (ref >59)
Globulin, Total: 2.3 g/dL (ref 1.5–4.5)
Glucose: 103 mg/dL — ABNORMAL HIGH (ref 65–99)
Potassium: 3.8 mmol/L (ref 3.5–5.2)
Sodium: 140 mmol/L (ref 134–144)
Total Protein: 7 g/dL (ref 6.0–8.5)

## 2019-11-11 LAB — LIPID PANEL
Chol/HDL Ratio: 2.9 ratio (ref 0.0–4.4)
Cholesterol, Total: 199 mg/dL (ref 100–199)
HDL: 68 mg/dL (ref >39)
LDL Chol Calc (NIH): 115 mg/dL — ABNORMAL HIGH (ref 0–99)
Triglycerides: 89 mg/dL (ref 0–149)
VLDL Cholesterol Cal: 16 mg/dL (ref 5–40)

## 2019-11-11 LAB — RUBELLA SCREEN: Rubella Antibodies, IGG: 2.05 index (ref >0.99)

## 2019-11-12 ENCOUNTER — Encounter: Payer: Self-pay | Admitting: Obstetrics and Gynecology

## 2020-01-19 ENCOUNTER — Encounter: Payer: Self-pay | Admitting: Obstetrics and Gynecology

## 2020-01-20 ENCOUNTER — Telehealth: Payer: Self-pay

## 2020-01-20 NOTE — Telephone Encounter (Signed)
Spoke with patient. LMP 12/18/19. Positive UPT on 7/19 and 7/20. Denies vaginal bleeding or pain. No ectopic risk factors. Requesting OV for pregnancy confirmation.  Patient will be out of town 7/23 -7/26.  Reviewed option of scheduling with OB provider, patient declined, states she has a provider in mind, will contact them to see how soon she can be seen. Request OV with Dr. Oscar La in the meantime. OV scheduled for 8/10 at 4pm. Advised if any new symptoms develop or concerns, return call to office. Patient agreeable. Covering provider will review, our office will return call if any additional recommendations.   Last AEX 11/10/19  Routing to covering provider for final review. Patient is agreeable to disposition. Will close encounter.

## 2020-01-20 NOTE — Telephone Encounter (Signed)
Pier, Laux  P [facility] Good Evening,   I just took a pregnancy test and it definitely had two lines on it that showed positive. What should I do next? Should I make an appointment to confirm?   Thank you!

## 2020-01-20 NOTE — Telephone Encounter (Signed)
Patient returned a call to Jill.   

## 2020-01-20 NOTE — Telephone Encounter (Signed)
Left message to call Noreene Larsson, RN at [facility] [phone].   Last AEX 11/10/19

## 2020-01-25 ENCOUNTER — Encounter: Payer: Self-pay | Admitting: Obstetrics and Gynecology

## 2020-01-26 ENCOUNTER — Telehealth: Payer: Self-pay | Admitting: Obstetrics and Gynecology

## 2020-01-26 NOTE — Telephone Encounter (Signed)
Patient sent following message via MyChart.  Hello,  I know I scheduled an appointment to confirm my pregnancy. When making the appointment she suggested for me to get into an office for the rest of my pregnancy and delivery. I have scheduled an appointment with another office. I was wondering if anything opening sooner than the 10th to confirm it? If not I will just cancel that appointment. Thank you!

## 2020-01-26 NOTE — Telephone Encounter (Signed)
Patient is returning call.  °

## 2020-01-26 NOTE — Telephone Encounter (Signed)
Return call to patient. Patient rescheduled pregnancy confirmation appointment for 02/05/2020 at 0830AM with Dr. Oscar La. Encounter closed.

## 2020-01-26 NOTE — Telephone Encounter (Signed)
Left message to return call to Hayley at [phone] 

## 2020-02-05 ENCOUNTER — Other Ambulatory Visit: Payer: Self-pay

## 2020-02-05 ENCOUNTER — Encounter: Payer: Self-pay | Admitting: Obstetrics and Gynecology

## 2020-02-05 ENCOUNTER — Ambulatory Visit: Payer: BC Managed Care – PPO | Admitting: Obstetrics and Gynecology

## 2020-02-05 VITALS — BP 100/64 | Ht 66.0 in

## 2020-02-05 DIAGNOSIS — N926 Irregular menstruation, unspecified: Secondary | ICD-10-CM | POA: Diagnosis not present

## 2020-02-05 LAB — POCT URINE PREGNANCY: Preg Test, Ur: POSITIVE — AB

## 2020-02-05 NOTE — Progress Notes (Signed)
GYNECOLOGY  VISIT   HPI: 24 y.o.   Married White or Caucasian Not Hispanic or Latino  female   [id number] with No LMP recorded.   here for  Pregnancy confermation  She went off of OCP's at the end of March and started PNV. Her first cycle off OCP's was over 30 days, the next 2 were 28-30. Last cycle was normal. On PNV.  First + pregnancy test was on 01/19/20. Already has an ultrasound appointment in the next few weeks.  No bleeding.   GYNECOLOGIC HISTORY: No LMP recorded. Contraception:none Menopausal hormone therapy: none        OB History    Gravida  0   Para  0   Term  0   Preterm  0   AB  0   Living  0     SAB  0   TAB  0   Ectopic  0   Multiple  0   Live Births  0              There are no problems to display for this patient.   Past Medical History:  Diagnosis Date  . Hernia of abdominal cavity     Past Surgical History:  Procedure Laterality Date  . ADENOIDECTOMY    . TYMPANOSTOMY TUBE PLACEMENT      Current Outpatient Medications  Medication Sig Dispense Refill  . naproxen sodium (ANAPROX DS) 550 MG tablet Take 1 tablet (550 mg total) by mouth 2 (two) times daily with a meal. 30 tablet 1   No current facility-administered medications for this visit.     ALLERGIES: Pear  No family history on file.  Social History   Socioeconomic History  . Marital status: Married    Spouse name: Not on file  . Number of children: Not on file  . Years of education: Not on file  . Highest education level: Not on file  Occupational History  . Not on file  Tobacco Use  . Smoking status: Never Smoker  . Smokeless tobacco: Never Used  Vaping Use  . Vaping Use: Never used  Substance and Sexual Activity  . Alcohol use: Yes    Alcohol/week: 2.0 standard drinks    Types: 2 Standard drinks or equivalent per week  . Drug use: No  . Sexual activity: Yes    Partners: Male    Birth control/protection: Pill  Other Topics Concern  . Not on file  Social  History Narrative  . Not on file   Social Determinants of Health   Financial Resource Strain:   . Difficulty of Paying Living Expenses:   Food Insecurity:   . Worried About Programme researcher, broadcasting/film/video in the Last Year:   . Barista in the Last Year:   Transportation Needs:   . Freight forwarder (Medical):   Marland Kitchen Lack of Transportation (Non-Medical):   Physical Activity:   . Days of Exercise per Week:   . Minutes of Exercise per Session:   Stress:   . Feeling of Stress :   Social Connections:   . Frequency of Communication with Friends and Family:   . Frequency of Social Gatherings with Friends and Family:   . Attends Religious Services:   . Active Member of Clubs or Organizations:   . Attends Banker Meetings:   Marland Kitchen Marital Status:   Intimate Partner Violence:   . Fear of Current or Ex-Partner:   . Emotionally Abused:   .  Physically Abused:   . Sexually Abused:     ROS  PHYSICAL EXAMINATION:    There were no vitals taken for this visit.    General appearance: alert, cooperative and appears stated age  ASSESSMENT Missed menses, +UPT, no risk factors. No c/o.    PLAN F/u with OB

## 2020-02-10 ENCOUNTER — Ambulatory Visit: Payer: Self-pay | Admitting: Obstetrics and Gynecology

## 2020-02-25 LAB — OB RESULTS CONSOLE HIV ANTIBODY (ROUTINE TESTING): HIV: NONREACTIVE

## 2020-02-25 LAB — OB RESULTS CONSOLE RUBELLA ANTIBODY, IGM: Rubella: IMMUNE

## 2020-02-25 LAB — OB RESULTS CONSOLE HEPATITIS B SURFACE ANTIGEN: Hepatitis B Surface Ag: NEGATIVE

## 2020-03-10 LAB — OB RESULTS CONSOLE GC/CHLAMYDIA
Chlamydia: NEGATIVE
Gonorrhea: NEGATIVE

## 2020-04-28 ENCOUNTER — Other Ambulatory Visit: Payer: Self-pay

## 2020-04-28 ENCOUNTER — Emergency Department (INDEPENDENT_AMBULATORY_CARE_PROVIDER_SITE_OTHER)
Admission: EM | Admit: 2020-04-28 | Discharge: 2020-04-28 | Disposition: A | Discharge status: Home / Self Care | Payer: BC Managed Care – PPO | Source: Home / Self Care | Attending: Family Medicine | Admitting: Family Medicine

## 2020-04-28 DIAGNOSIS — R0789 Other chest pain: Secondary | ICD-10-CM | POA: Diagnosis not present

## 2020-04-28 NOTE — Discharge Instructions (Addendum)
Apply ice pack for 20 to 30 minutes, 3 to 4 times daily  Continue until pain decreases. May take Tylenol if needed for pain.

## 2020-04-28 NOTE — ED Triage Notes (Signed)
Patient presents to Urgent Care with complaints of left rib cage pain intermittently and sharp since earlier today. Went away and then returned when she bent over to get something out of the fridge (has not gone away). Pain shot up from her rib cage and into her shoulder and up her neck slightly (left side). Patient reports she is [redacted] weeks pregnant. Pt denies n/v/d or sob.

## 2020-04-28 NOTE — ED Provider Notes (Signed)
[facility]    CSN: [id number] Arrival date & time: 04/28/20  1524      History   Chief Complaint Chief Complaint  Patient presents with  . Shoulder Pain    HPI Kristi Brewer is a 24 y.o. female.   Patient reached over to grab a drink bottle about 3.5 hours ago and felt a sudden sharp pain in her left anterior/inferior ribs.  The pain improved until later when she reached into a refrigerator and again felt the same pain that is now persistent.  The pain is worse with chest movement and deep inspiration.  She denies shortness of breath, fever, nausea, and cough.  She denies lower leg pain/swelling and has no history of DVT. She is presently [redacted] weeks pregnant.  The history is provided by the patient.  Chest Pain Pain location:  L chest Pain quality: sharp and stabbing   Pain radiates to:  Neck Pain severity:  Moderate Onset quality:  Sudden Duration:  3 hours Timing:  Constant Progression:  Unchanged Chronicity:  New Context: breathing, lifting and movement   Context: not raising an arm   Relieved by:  None tried Worsened by:  Certain positions, deep breathing and movement Ineffective treatments:  None tried Associated symptoms: no abdominal pain, no back pain, no cough, no diaphoresis, no dizziness, no dysphagia, no fatigue, no fever, no headache, no heartburn, no lower extremity edema, no nausea, no palpitations, no shortness of breath and no vomiting   Risk factors: pregnancy   Risk factors: no prior DVT/PE     Past Medical History:  Diagnosis Date  . Hernia of abdominal cavity     There are no problems to display for this patient.   Past Surgical History:  Procedure Laterality Date  . ADENOIDECTOMY    . TYMPANOSTOMY TUBE PLACEMENT      OB History    Gravida  1   Para  0   Term  0   Preterm  0   AB  0   Living  0     SAB  0   TAB  0   Ectopic  0   Multiple  0   Live Births  0            Home Medications    Prior  to Admission medications   Not on File    Family History Family History  Problem Relation Age of Onset  . Diabetes Mother     Social History Social History   Tobacco Use  . Smoking status: Never Smoker  . Smokeless tobacco: Never Used  Vaping Use  . Vaping Use: Never used  Substance Use Topics  . Alcohol use: Not Currently    Alcohol/week: 2.0 standard drinks    Types: 2 Standard drinks or equivalent per week    Comment: not while pregnany  . Drug use: No     Allergies   Pear   Review of Systems Review of Systems  Constitutional: Negative for diaphoresis, fatigue and fever.  HENT: Negative for trouble swallowing.   Respiratory: Negative for cough, chest tightness, shortness of breath, wheezing and stridor.   Cardiovascular: Positive for chest pain. Negative for palpitations and leg swelling.  Gastrointestinal: Negative for abdominal pain, heartburn, nausea and vomiting.  Musculoskeletal: Negative for back pain.  Neurological: Negative for dizziness and headaches.     Physical Exam Triage Vital Signs ED Triage Vitals  Enc Vitals Group     BP 04/28/20 1542 113/77  Pulse Rate 04/28/20 1542 92     Resp 04/28/20 1542 16     Temp 04/28/20 1542 99 F (37.2 C)     Temp Source 04/28/20 1542 Oral     SpO2 04/28/20 1542 100 %     Weight --      Height --      Head Circumference --      Peak Flow --      Pain Score 04/28/20 1539 6     Pain Loc --      Pain Edu? --      Excl. in GC? --    No data found.  Updated Vital Signs BP 113/77 (BP Location: Left Arm)   Pulse 92   Temp 99 F (37.2 C) (Oral)   Resp 16   LMP 12/18/2019   SpO2 100%   Visual Acuity Right Eye Distance:   Left Eye Distance:   Bilateral Distance:    Right Eye Near:   Left Eye Near:    Bilateral Near:     Physical Exam Vitals and nursing note reviewed.  Constitutional:      General: She is not in acute distress. HENT:     Head: Normocephalic.     Right Ear: External ear  normal.     Left Ear: External ear normal.     Nose: Nose normal.     Mouth/Throat:     Pharynx: Oropharynx is clear.  Eyes:     Conjunctiva/sclera: Conjunctivae normal.     Pupils: Pupils are equal, round, and reactive to light.  Cardiovascular:     Rate and Rhythm: Normal rate and regular rhythm.     Heart sounds: Normal heart sounds.  Pulmonary:     Breath sounds: Normal breath sounds. No wheezing, rhonchi or rales.  Chest:     Chest wall: Tenderness present.       Comments: Distinct tenderness to palpation over intercostal muscles in left inferior chest as noted on diagram.  Abdominal:     Palpations: Abdomen is soft.     Tenderness: There is no abdominal tenderness.  Musculoskeletal:        General: No tenderness.     Cervical back: Normal range of motion and neck supple. No tenderness.     Right lower leg: No edema.     Left lower leg: No edema.  Lymphadenopathy:     Cervical: No cervical adenopathy.  Skin:    General: Skin is warm and dry.     Findings: No rash.  Neurological:     Mental Status: She is alert and oriented to person, place, and time.      UC Treatments / Results  Labs (all labs ordered are listed, but only abnormal results are displayed) Labs Reviewed - No data to display  EKG  Rate:  78 BPM PR:  160 msec QT:  384 msec QTcH:  437 msec QRSD:  88 msec QRS axis:  3 degrees Interpretation: Normal sinus rhythm; within normal limits   Radiology No results found.  Procedures Procedures (including critical care time)  Medications Ordered in UC Medications - No data to display  Initial Impression / Assessment and Plan / UC Course  I have reviewed the triage vital signs and the nursing notes.  Pertinent labs & imaging results that were available during my care of the patient were reviewed by me and considered in my medical decision making (see chart for details).    Note normal EKG Reassurance. Followup  with OBGYN.  Final Clinical  Impressions(s) / UC Diagnoses   Final diagnoses:  Musculoskeletal chest pain     Discharge Instructions     Apply ice pack for 20 to 30 minutes, 3 to 4 times daily  Continue until pain decreases. May take Tylenol if needed for pain.    ED Prescriptions    None        Lattie Haw, MD 05/02/20 1744

## 2020-07-03 NOTE — L&D Delivery Note (Signed)
Delivery Note At 11:16 PM a viable female was delivered via Vaginal, Spontaneous (Presentation: Left Occiput Anterior).  APGAR: 9, 9; weight  .   Placenta status: Spontaneous, Intact.  Cord: 3 vessels with the following complications: None.  Cord pH: not sent  Anesthesia: Epidural Episiotomy: None Lacerations: 2nd degree Suture Repair: 3.0 vicryl Est. Blood Loss (mL): 200   It's a girl - "Remi"!!   Mom to postpartum.  Baby to Couplet care / Skin to Skin.  Ranae Pila 09/10/2020, 11:47 PM

## 2020-07-14 ENCOUNTER — Encounter: Payer: BC Managed Care – PPO | Attending: Obstetrics and Gynecology | Admitting: Registered"

## 2020-07-14 ENCOUNTER — Encounter: Payer: Self-pay | Admitting: Registered"

## 2020-07-14 ENCOUNTER — Other Ambulatory Visit: Payer: Self-pay

## 2020-07-14 DIAGNOSIS — O24419 Gestational diabetes mellitus in pregnancy, unspecified control: Secondary | ICD-10-CM | POA: Insufficient documentation

## 2020-07-14 NOTE — Progress Notes (Signed)
Patient was seen on 07/14/20 for Gestational Diabetes self-management class at the [facility]. Patient has a good understanding of carb counting prior to class. Pt reports eating balanced meals on the lower end of carb intake. Pt was late for appointment but RD felt she had a good understanding of the material in the abbreviated visit. Pt was encouraged to reach out if she felt she would needed more support to manage her GDM.  The following learning objectives were met by the patient during this course:   States the definition of Gestational Diabetes  States why dietary management is important in controlling blood glucose  Describes the effects each nutrient has on blood glucose levels  Demonstrates ability to create a balanced meal plan  Demonstrates carbohydrate counting   States when to check blood glucose levels  Demonstrates proper blood glucose monitoring techniques  States the effect of stress and exercise on blood glucose levels  States the importance of limiting caffeine and abstaining from alcohol and smoking  Blood glucose monitor given: OneTouch Verio Flex Lot# [id number] x Exp: 01/30/2021 Blood Glucose: 108 mg/dL  Patient instructed to monitor glucose levels: FBS: 60 - <95; 1 hour: <140; 2 hour: <120  Patient received handouts:  Nutrition Diabetes and Pregnancy, including carb counting list  Patient will be seen for follow-up as needed.

## 2020-09-08 ENCOUNTER — Other Ambulatory Visit: Payer: Self-pay

## 2020-09-08 ENCOUNTER — Inpatient Hospital Stay (EMERGENCY_DEPARTMENT_HOSPITAL)
Admission: AD | Admit: 2020-09-08 | Discharge: 2020-09-08 | Disposition: A | Discharge status: Home / Self Care | Payer: BC Managed Care – PPO | Source: Home / Self Care | Attending: Obstetrics & Gynecology | Admitting: Obstetrics & Gynecology

## 2020-09-08 ENCOUNTER — Encounter (HOSPITAL_COMMUNITY): Payer: Self-pay | Admitting: Obstetrics & Gynecology

## 2020-09-08 DIAGNOSIS — Z3A36 36 weeks gestation of pregnancy: Secondary | ICD-10-CM

## 2020-09-08 DIAGNOSIS — R55 Syncope and collapse: Secondary | ICD-10-CM

## 2020-09-08 DIAGNOSIS — R030 Elevated blood-pressure reading, without diagnosis of hypertension: Secondary | ICD-10-CM

## 2020-09-08 DIAGNOSIS — O26893 Other specified pregnancy related conditions, third trimester: Secondary | ICD-10-CM

## 2020-09-08 DIAGNOSIS — H539 Unspecified visual disturbance: Secondary | ICD-10-CM

## 2020-09-08 DIAGNOSIS — Z3689 Encounter for other specified antenatal screening: Secondary | ICD-10-CM

## 2020-09-08 LAB — COMPREHENSIVE METABOLIC PANEL
ALT: 24 U/L (ref 0–44)
AST: 28 U/L (ref 15–41)
Albumin: 3.1 g/dL — ABNORMAL LOW (ref 3.5–5.0)
Alkaline Phosphatase: 185 U/L — ABNORMAL HIGH (ref 38–126)
Anion gap: 9 (ref 5–15)
BUN: 10 mg/dL (ref 6–20)
CO2: 24 mmol/L (ref 22–32)
Calcium: 9.4 mg/dL (ref 8.9–10.3)
Chloride: 100 mmol/L (ref 98–111)
Creatinine, Ser: 0.64 mg/dL (ref 0.44–1.00)
GFR, Estimated: >60 mL/min (ref >60)
Glucose, Bld: 116 mg/dL — ABNORMAL HIGH (ref 70–99)
Potassium: 3.4 mmol/L — ABNORMAL LOW (ref 3.5–5.1)
Sodium: 133 mmol/L — ABNORMAL LOW (ref 135–145)
Total Bilirubin: 1 mg/dL (ref 0.3–1.2)
Total Protein: 6.1 g/dL — ABNORMAL LOW (ref 6.5–8.1)

## 2020-09-08 LAB — CBC
HCT: 36.3 % (ref 36.0–46.0)
Hemoglobin: 12.6 g/dL (ref 12.0–15.0)
MCH: 29.6 pg (ref 26.0–34.0)
MCHC: 34.7 g/dL (ref 30.0–36.0)
MCV: 85.4 fL (ref 80.0–100.0)
Platelets: 234 10*3/uL (ref 150–400)
RBC: 4.25 MIL/uL (ref 3.87–5.11)
RDW: 13 % (ref 11.5–15.5)
WBC: 12.4 10*3/uL — ABNORMAL HIGH (ref 4.0–10.5)
nRBC: 0 % (ref 0.0–0.2)

## 2020-09-08 LAB — URINALYSIS, ROUTINE W REFLEX MICROSCOPIC
Bilirubin Urine: NEGATIVE
Glucose, UA: NEGATIVE mg/dL
Hgb urine dipstick: NEGATIVE
Ketones, ur: NEGATIVE mg/dL
Leukocytes,Ua: NEGATIVE
Nitrite: NEGATIVE
Protein, ur: NEGATIVE mg/dL
Specific Gravity, Urine: 1.005 (ref 1.005–1.030)
pH: 6 (ref 5.0–8.0)

## 2020-09-08 LAB — PROTEIN / CREATININE RATIO, URINE
Creatinine, Urine: 28.05 mg/dL
Total Protein, Urine: <6 mg/dL

## 2020-09-08 NOTE — MAU Provider Note (Signed)
History     CSN: [id number]  Arrival date and time: 09/08/20 1639   Event Date/Time   First Provider Initiated Contact with Patient 09/08/20 1720      Chief Complaint  Patient presents with  . Visual Disturbances  . Shortness of Breath  . Heart Flutters   HPI Kristi Brewer is 25 y.o. G1P0000 at [redacted]w[redacted]d who presents to MAU with multiple complaints:  Visual Disturbances This is a new problem, onset last night. Patient endorses having difficulty focusing her eyes. She also reports seeing spots intermittently last night and throughout the day today. 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 She denies headache, RUQ/epigastric pain, new onset swelling or weight gain.  "Palpitations" Patient endorses feeling as if her heartbeat was very fast and she was close to fainting. She denies chest pain, weakness, syncope. She denies personal history of acute cardiac events.  "Feeling Off" Patient states she does not feel well and "something is off".  She denies lower abdominal pain, vaginal bleeding, leaking of fluid, decreased fetal movement, fever, falls, or recent illness. Pain score 0/10 throughout evaluation in MAU.  She receives care with [facility].  OB History    Gravida  1   Para  0   Term  0   Preterm  0   AB  0   Living  0     SAB  0   IAB  0   Ectopic  0   Multiple  0   Live Births  0           Past Medical History:  Diagnosis Date  . Hernia of abdominal cavity     Past Surgical History:  Procedure Laterality Date  . ADENOIDECTOMY    . TYMPANOSTOMY TUBE PLACEMENT      Family History  Problem Relation Age of Onset  . Diabetes Mother     Social History   Tobacco Use  . Smoking status: Never Smoker  . Smokeless tobacco: Never Used  Vaping Use  . Vaping Use: Never used  Substance Use Topics  . Alcohol use: Not Currently    Alcohol/week: 2.0  standard drinks    Types: 2 Standard drinks or equivalent per week    Comment: not while pregnany  . Drug use: No    Allergies:  Allergies  Allergen Reactions  . Pear Swelling    No medications prior to admission.    Review of Systems  Eyes: Positive for visual disturbance.  Gastrointestinal: Negative for abdominal pain.  Genitourinary: Negative for vaginal bleeding.  Musculoskeletal: Negative for back pain.  All other systems reviewed and are negative.  Physical Exam   Blood pressure 128/87, pulse 84, temperature 98.1 F (36.7 C), temperature source Oral, resp. rate 20, height 5\' 6"  (1.676 m), weight 89.1 kg, last menstrual period 12/18/2019, SpO2 99 %.  Physical Exam Vitals and nursing note reviewed.  Constitutional:      Appearance: She is well-developed.  Cardiovascular:     Rate and Rhythm: Normal rate.  Pulmonary:     Effort: Pulmonary effort is normal.     Breath sounds: Normal breath sounds.  Abdominal:     Palpations: Abdomen is soft.     Comments: Gravid  Skin:    Capillary Refill: Capillary refill takes less than 2 seconds.  Neurological:     Mental Status: She is alert and oriented to person,  place, and time.  Psychiatric:        Mood and Affect: Mood normal.        Behavior: Behavior normal.     MAU Course  Procedures  --No acute findings on ED EKG: Per MAU machine readout "NSR with sinus arrhythmia, left axis deviation". Reviewed by Dr. Myriam Jacobson who advised outpatient symptom monitoring, no intervention indicated. Discussed activity tolerance at 36w 6d, possible orthostatic influence.  --Reactive tracing: baseline 140, mod var, + 15 x 15 accels, no decels  --Elevated BP x 1, PEC labs WNL. Discussed plan of care with Dr. Alysia Penna who is in agreement. CNM at bedside to discuss plan for discharge and BP check in office this week. Patient then stated she was "led to believe" that Dr. Langston Masker would be caring for her in MAU. MAU procedure reviewed with  patient. Patient requesting Dr. Langston Masker be called to attend patient, consult on management. Dr. Langston Masker called by CNM, ROS, Physical exam, lab results and discharge plan reviewed. Per Dr. Langston Masker, patient safe for discharge home, plan for BP check on Friday 09/10/2020 when Dr. Langston Masker is in office.   Orders Placed This Encounter  Procedures  . Protein / creatinine ratio, urine  . Urinalysis, Routine w reflex microscopic Urine, Clean Catch  . CBC  . Comprehensive metabolic panel  . ED EKG  . Discharge patient   Patient Vitals for the past 24 hrs:  BP Temp Temp src Pulse Resp SpO2 Height Weight  09/08/20 1816 123/89 -- -- (!) 111 -- -- -- --  09/08/20 1801 126/86 -- -- 87 -- -- -- --  09/08/20 1746 127/83 -- -- 88 -- -- -- --  09/08/20 1730 128/87 -- -- 84 -- 99 % -- --  09/08/20 1715 129/86 -- -- 87 -- 99 % -- --  09/08/20 1655 (!) 134/92 98.1 F (36.7 C) Oral 95 20 100 % -- --  09/08/20 1647 -- -- -- -- -- -- 5\' 6"  (1.676 m) 89.1 kg   Results for orders placed or performed during the hospital encounter of 09/08/20 (from the past 24 hour(s))  Protein / creatinine ratio, urine     Status: None   Collection Time: 09/08/20  4:57 PM  Result Value Ref Range   Creatinine, Urine 28.05 mg/dL   Total Protein, Urine <6 mg/dL   Protein Creatinine Ratio        0.00 - 0.15 mg/mg[Cre]  Urinalysis, Routine w reflex microscopic Urine, Clean Catch     Status: Abnormal   Collection Time: 09/08/20  4:58 PM  Result Value Ref Range   Color, Urine STRAW (A) YELLOW   APPearance CLEAR CLEAR   Specific Gravity, Urine 1.005 1.005 - 1.030   pH 6.0 5.0 - 8.0   Glucose, UA NEGATIVE NEGATIVE mg/dL   Hgb urine dipstick NEGATIVE NEGATIVE   Bilirubin Urine NEGATIVE NEGATIVE   Ketones, ur NEGATIVE NEGATIVE mg/dL   Protein, ur NEGATIVE NEGATIVE mg/dL   Nitrite NEGATIVE NEGATIVE   Leukocytes,Ua NEGATIVE NEGATIVE  CBC     Status: Abnormal   Collection Time: 09/08/20  5:06 PM  Result Value Ref Range   WBC 12.4  (H) 4.0 - 10.5 K/uL   RBC 4.25 3.87 - 5.11 MIL/uL   Hemoglobin 12.6 12.0 - 15.0 g/dL   HCT 11/08/20 87.5 - 64.3 %   MCV 85.4 80.0 - 100.0 fL   MCH 29.6 26.0 - 34.0 pg   MCHC 34.7 30.0 - 36.0 g/dL   RDW 32.9 51.8 -  15.5 %   Platelets 234 150 - 400 K/uL   nRBC 0.0 0.0 - 0.2 %  Comprehensive metabolic panel     Status: Abnormal   Collection Time: 09/08/20  5:06 PM  Result Value Ref Range   Sodium 133 (L) 135 - 145 mmol/L   Potassium 3.4 (L) 3.5 - 5.1 mmol/L   Chloride 100 98 - 111 mmol/L   CO2 24 22 - 32 mmol/L   Glucose, Bld 116 (H) 70 - 99 mg/dL   BUN 10 6 - 20 mg/dL   Creatinine, Ser 2.70 0.44 - 1.00 mg/dL   Calcium 9.4 8.9 - 35.0 mg/dL   Total Protein 6.1 (L) 6.5 - 8.1 g/dL   Albumin 3.1 (L) 3.5 - 5.0 g/dL   AST 28 15 - 41 U/L   ALT 24 0 - 44 U/L   Alkaline Phosphatase 185 (H) 38 - 126 U/L   Total Bilirubin 1.0 0.3 - 1.2 mg/dL   GFR, Estimated >09 >38 mL/min   Anion gap 9 5 - 15   Assessment and Plan  --25 y.o. G1P0000 at [redacted]w[redacted]d  --Reactive tracing --Elevated BP x 1, PEC labs WNL --S/p consult with Dr. Alysia Penna and Dr. Langston Masker --Discharge home in stable condition  F/U: --Patient to present to office on Friday for BP check  Calvert Cantor, CNM 09/08/2020, 8:35 PM

## 2020-09-08 NOTE — MAU Note (Signed)
Presents with c/o visual disturbances (seeing spots & blurred vision), heart fluttering, SOB, and light headedness since 1100 this morning.  States she doesn't feel well, feels "off". Endorses +FM.  Denies VB or LOF.

## 2020-09-08 NOTE — Discharge Instructions (Signed)
Orthostatic Hypotension Blood pressure is a measurement of how strongly, or weakly, your blood is pressing against the walls of your arteries. Orthostatic hypotension is a sudden drop in blood pressure that happens when you quickly change positions, such as when you get up from sitting or lying down. Arteries are blood vessels that carry blood from your heart throughout your body. When blood pressure is too low, you may not get enough blood to your brain or to the rest of your organs. This can cause weakness, light-headedness, rapid heartbeat, and fainting. This can last for just a few seconds or for up to a few minutes. Orthostatic hypotension is usually not a serious problem. However, if it happens frequently or gets worse, it may be a sign of something more serious. What are the causes? This condition may be caused by:  Sudden changes in posture, such as standing up quickly after you have been sitting or lying down.  Blood loss.  Loss of body fluids (dehydration).  Heart problems.  Hormone (endocrine) problems.  Pregnancy.  Severe infection.  Lack of certain nutrients.  Severe allergic reactions (anaphylaxis).  Certain medicines, such as blood pressure medicine or medicines that make the body lose excess fluids (diuretics). Sometimes, this condition can be caused by not taking medicine as directed, such as taking too much of a certain medicine. What increases the risk? The following factors may make you more likely to develop this condition:  Age. Risk increases as you get older.  Conditions that affect the heart or the central nervous system.  Taking certain medicines, such as blood pressure medicine or diuretics.  Being pregnant. What are the signs or symptoms? Symptoms of this condition may include:  Weakness.  Light-headedness.  Dizziness.  Blurred vision.  Fatigue.  Rapid heartbeat.  Fainting, in severe cases. How is this diagnosed? This condition is  diagnosed based on:  Your medical history.  Your symptoms.  Your blood pressure measurement. Your health care provider will check your blood pressure when you are: ? Lying down. ? Sitting. ? Standing. A blood pressure reading is recorded as two numbers, such as "120 over 80" (or 120/80). The first ("top") number is called the systolic pressure. It is a measure of the pressure in your arteries as your heart beats. The second ("bottom") number is called the diastolic pressure. It is a measure of the pressure in your arteries when your heart relaxes between beats. Blood pressure is measured in a unit called mm Hg. Healthy blood pressure for most adults is 120/80. If your blood pressure is below 90/60, you may be diagnosed with hypotension. Other information or tests that may be used to diagnose orthostatic hypotension include:  Your other vital signs, such as your heart rate and temperature.  Blood tests.  Tilt table test. For this test, you will be safely secured to a table that moves you from a lying position to an upright position. Your heart rhythm and blood pressure will be monitored during the test. How is this treated? This condition may be treated by:  Changing your diet. This may involve eating more salt (sodium) or drinking more water.  Taking medicines to raise your blood pressure.  Changing the dosage of certain medicines you are taking that might be lowering your blood pressure.  Wearing compression stockings. These stockings help to prevent blood clots and reduce swelling in your legs. In some cases, you may need to go to the hospital for:  Fluid replacement. This means you will   receive fluids through an IV.  Blood replacement. This means you will receive donated blood through an IV (transfusion).  Treating an infection or heart problems, if this applies.  Monitoring. You may need to be monitored while medicines that you are taking wear off. Follow these instructions  at home: Eating and drinking  Drink enough fluid to keep your urine pale yellow.  Eat a healthy diet, and follow instructions from your health care provider about eating or drinking restrictions. A healthy diet includes: ? Fresh fruits and vegetables. ? Whole grains. ? Lean meats. ? Low-fat dairy products.  Eat extra salt only as directed. Do not add extra salt to your diet unless your health care provider told you to do that.  Eat frequent, small meals.  Avoid standing up suddenly after eating.   Medicines  Take over-the-counter and prescription medicines only as told by your health care provider. ? Follow instructions from your health care provider about changing the dosage of your current medicines, if this applies. ? Do not stop or adjust any of your medicines on your own. General instructions  Wear compression stockings as told by your health care provider.  Get up slowly from lying down or sitting positions. This gives your blood pressure a chance to adjust.  Avoid hot showers and excessive heat as directed by your health care provider.  Return to your normal activities as told by your health care provider. Ask your health care provider what activities are safe for you.  Do not use any products that contain nicotine or tobacco, such as cigarettes, e-cigarettes, and chewing tobacco. If you need help quitting, ask your health care provider.  Keep all follow-up visits as told by your health care provider. This is important.   Contact a health care provider if you:  Vomit.  Have diarrhea.  Have a fever for more than 2-3 days.  Feel more thirsty than usual.  Feel weak and tired. Get help right away if you:  Have chest pain.  Have a fast or irregular heartbeat.  Develop numbness in any part of your body.  Cannot move your arms or your legs.  Have trouble speaking.  Become sweaty or feel light-headed.  Faint.  Feel short of breath.  Have trouble staying  awake.  Feel confused. Summary  Orthostatic hypotension is a sudden drop in blood pressure that happens when you quickly change positions.  Orthostatic hypotension is usually not a serious problem.  It is diagnosed by having your blood pressure taken lying down, sitting, and then standing.  It may be treated by changing your diet or adjusting your medicines. This information is not intended to replace advice given to you by your health care provider. Make sure you discuss any questions you have with your health care provider. Document Revised: 12/13/2017 Document Reviewed: 12/13/2017 Elsevier Patient Education  2021 [employer].   Third Trimester of Pregnancy  The third trimester of pregnancy is from week 28 through week 40. This is also called months 7 through 9. This trimester is when your unborn baby (fetus) is growing very fast. At the end of the ninth month, the unborn baby is about 20 inches long. It weighs about 6-10 pounds. Body changes during your third trimester Your body continues to go through many changes during this time. The changes vary and generally return to normal after the baby is born. Physical changes  Your weight will continue to increase. You may gain 25-35 pounds (11-16 kg) by the end  of the pregnancy. If you are underweight, you may gain 28-40 lb (about 13-18 kg). If you are overweight, you may gain 15-25 lb (about 7-11 kg).  You may start to get stretch marks on your hips, belly (abdomen), and breasts.  Your breasts will continue to grow and may hurt. A yellow fluid (colostrum) may leak from your breasts. This is the first milk you are making for your baby.  You may have changes in your hair.  Your belly button may stick out.  You may have more swelling in your hands, face, or ankles. Health changes  You may have heartburn.  You may have trouble pooping (constipation).  You may get hemorrhoids. These are swollen veins in the butt that can itch or  get painful.  You may have swollen veins (varicose veins) in your legs.  You may have more body aches in the pelvis, back, or thighs.  You may have more tingling or numbness in your hands, arms, and legs. The skin on your belly may also feel numb.  You may feel short of breath as your womb (uterus) gets bigger. Other changes  You may pee (urinate) more often.  You may have more problems sleeping.  You may notice the unborn baby "dropping," or moving lower in your belly.  You may have more discharge coming from your vagina.  Your joints may feel loose, and you may have pain around your pelvic bone. Follow these instructions at home: Medicines  Take over-the-counter and prescription medicines only as told by your doctor. Some medicines are not safe during pregnancy.  Take a prenatal vitamin that contains at least 600 micrograms (mcg) of folic acid. Eating and drinking  Eat healthy meals that include: ? Fresh fruits and vegetables. ? Whole grains. ? Good sources of protein, such as meat, eggs, or tofu. ? Low-fat dairy products.  Avoid raw meat and unpasteurized juice, milk, and cheese. These carry germs that can harm you and your baby.  Eat 4 or 5 small meals rather than 3 large meals a day.  You may need to take these actions to prevent or treat trouble pooping: ? Drink enough fluids to keep your pee (urine) pale yellow. ? Eat foods that are high in fiber. These include beans, whole grains, and fresh fruits and vegetables. ? Limit foods that are high in fat and sugar. These include fried or sweet foods. Activity  Exercise only as told by your doctor. Stop exercising if you start to have cramps in your womb.  Avoid heavy lifting.  Do not exercise if it is too hot or too humid, or if you are in a place of great height (high altitude).  If you choose to, you may have sex unless your doctor tells you not to. Relieving pain and discomfort  Take breaks often, and rest  with your legs raised (elevated) if you have leg cramps or low back pain.  Take warm water baths (sitz baths) to soothe pain or discomfort caused by hemorrhoids. Use hemorrhoid cream if your doctor approves.  Wear a good support bra if your breasts are tender.  If you develop bulging, swollen veins in your legs: ? Wear support hose as told by your doctor. ? Raise your feet for 15 minutes, 3-4 times a day. ? Limit salt in your food. Safety  Talk to your doctor before traveling far distances.  Do not use hot tubs, steam rooms, or saunas.  Wear your seat belt at all times when you  are in a car.  Talk with your doctor if someone is hurting you or yelling at you a lot. Preparing for your baby's arrival To prepare for the arrival of your baby:  Take prenatal classes.  Visit the hospital and tour the maternity area.  Buy a rear-facing car seat. Learn how to install it in your car.  Prepare the baby's room. Take out all pillows and stuffed animals from the baby's crib. General instructions  Avoid cat litter boxes and soil used by cats. These carry germs that can cause harm to the baby and can cause a loss of your baby by miscarriage or stillbirth.  Do not douche or use tampons. Do not use scented sanitary pads.  Do not smoke or use any products that contain nicotine or tobacco. If you need help quitting, ask your doctor.  Do not drink alcohol.  Do not use herbal medicines, illegal drugs, or medicines that were not approved by your doctor. Chemicals in these products can affect your baby.  Keep all follow-up visits. This is important. Where to find more information  American Pregnancy Association: americanpregnancy.org  [employer]: www.acog.org  Office on Women's Health: [employer]co.nz Contact a doctor if:  You have a fever.  You have mild cramps or pressure in your lower belly.  You have a nagging pain in your belly  area.  You vomit, or you have watery poop (diarrhea).  You have bad-smelling fluid coming from your vagina.  You have pain when you pee, or your pee smells bad.  You have a headache that does not go away when you take medicine.  You have changes in how you see, or you see spots in front of your eyes. Get help right away if:  Your water breaks.  You have regular contractions that are less than 5 minutes apart.  You are spotting or bleeding from your vagina.  You have very bad belly cramps or pain.  You have trouble breathing.  You have chest pain.  You faint.  You have not felt the baby move for the amount of time told by your doctor.  You have new or increased pain, swelling, or redness in an arm or leg. Summary  The third trimester is from week 28 through week 40 (months 7 through 9). This is the time when your unborn baby is growing very fast.  During this time, your discomfort may increase as you gain weight and as your baby grows.  Get ready for your baby to arrive by taking prenatal classes, buying a rear-facing car seat, and preparing the baby's room.  Get help right away if you are bleeding from your vagina, you have chest pain and trouble breathing, or you have not felt the baby move for the amount of time told by your doctor. This information is not intended to replace advice given to you by your health care provider. Make sure you discuss any questions you have with your health care provider. Document Revised: 11/26/2019 Document Reviewed: 10/02/2019 Elsevier Patient Education  2021 [employer].

## 2020-09-09 NOTE — H&P (Signed)
Kristi Brewer is a 25 y.o. female presenting for IOL s/s gestational HTN. Reports having new onset intermittent visual disturbances but not persistent. PIH labs WNL yesterday. Denies other overt s/s PIH.  Pregnancy also c/b A1GDM which is under great control.   OB History    Gravida  1   Para  0   Term  0   Preterm  0   AB  0   Living  0     SAB  0   IAB  0   Ectopic  0   Multiple  0   Live Births  0          Past Medical History:  Diagnosis Date  . Hernia of abdominal cavity    Past Surgical History:  Procedure Laterality Date  . ADENOIDECTOMY    . TYMPANOSTOMY TUBE PLACEMENT     Family History: family history includes Diabetes in her mother. Social History:  reports that she has never smoked. She has never used smokeless tobacco. She reports previous alcohol use of about 2.0 standard drinks of alcohol per week. She reports that she does not use drugs.     Maternal Diabetes: No Genetic Screening: Normal Maternal Ultrasounds/Referrals: Normal Fetal Ultrasounds or other Referrals:  None Maternal Substance Abuse:  No Significant Maternal Medications:  None Significant Maternal Lab Results:  None Other Comments:  None  Review of Systems History   Last menstrual period 12/18/2019. Exam Physical Exam  (from office) NAD, A&O NWOB Abd soft, nondistended, gravid  Prenatal labs: ABO, Rh:   Antibody:   Rubella: 2.05 (05/10 1507) RPR:    HBsAg:    HIV:    GBS:   unknown  Assessment/Plan: 25 yo G1P0 @ 69 wga presenting for IOL s/s gestational hypertension.  # IOL - Cervix unfavorable. Plan for cytotec followed by pitocin/AROM when more favorable.    # PIH w/out severe features - mild range Bps. NO s/s severe disease. While has had some visual disturbance, it is not persistent. Will defer Magnesium for now. PIH labs yesterday in [facility] WNL. Repeat PRN.  She has been counseled regarding the risks of PIH.   # A1GDM - excellent control  # GBS -  unknown but term and without RFs. Defer treatment.    Ranae Pila 09/09/2020, 1:54 PM

## 2020-09-10 ENCOUNTER — Inpatient Hospital Stay (HOSPITAL_COMMUNITY)
Admission: AD | Admit: 2020-09-10 | Discharge: 2020-09-12 | DRG: 807 | Disposition: A | Discharge status: Home / Self Care | Payer: BC Managed Care – PPO | Attending: Obstetrics and Gynecology | Admitting: Obstetrics and Gynecology

## 2020-09-10 ENCOUNTER — Inpatient Hospital Stay (HOSPITAL_COMMUNITY): Payer: BC Managed Care – PPO

## 2020-09-10 ENCOUNTER — Inpatient Hospital Stay (HOSPITAL_COMMUNITY): Payer: BC Managed Care – PPO | Admitting: Anesthesiology

## 2020-09-10 ENCOUNTER — Other Ambulatory Visit: Payer: Self-pay

## 2020-09-10 DIAGNOSIS — Z20822 Contact with and (suspected) exposure to covid-19: Secondary | ICD-10-CM | POA: Diagnosis present

## 2020-09-10 DIAGNOSIS — Z3A37 37 weeks gestation of pregnancy: Secondary | ICD-10-CM | POA: Diagnosis not present

## 2020-09-10 DIAGNOSIS — O134 Gestational [pregnancy-induced] hypertension without significant proteinuria, complicating childbirth: Principal | ICD-10-CM | POA: Diagnosis present

## 2020-09-10 DIAGNOSIS — O24420 Gestational diabetes mellitus in childbirth, diet controlled: Secondary | ICD-10-CM | POA: Diagnosis present

## 2020-09-10 DIAGNOSIS — O139 Gestational [pregnancy-induced] hypertension without significant proteinuria, unspecified trimester: Secondary | ICD-10-CM | POA: Diagnosis present

## 2020-09-10 LAB — TYPE AND SCREEN
ABO/RH(D): A POS
Antibody Screen: NEGATIVE

## 2020-09-10 LAB — CBC
HCT: 35.8 % — ABNORMAL LOW (ref 36.0–46.0)
Hemoglobin: 12 g/dL (ref 12.0–15.0)
MCH: 29.3 pg (ref 26.0–34.0)
MCHC: 33.5 g/dL (ref 30.0–36.0)
MCV: 87.3 fL (ref 80.0–100.0)
Platelets: 217 10*3/uL (ref 150–400)
RBC: 4.1 MIL/uL (ref 3.87–5.11)
RDW: 13.1 % (ref 11.5–15.5)
WBC: 10.4 10*3/uL (ref 4.0–10.5)
nRBC: 0 % (ref 0.0–0.2)

## 2020-09-10 LAB — GLUCOSE, CAPILLARY
Glucose-Capillary: 86 mg/dL (ref 70–99)
Glucose-Capillary: 79 mg/dL (ref 70–99)
Glucose-Capillary: 88 mg/dL (ref 70–99)

## 2020-09-10 LAB — RESP PANEL BY RT-PCR (FLU A&B, COVID) ARPGX2
Influenza A by PCR: NEGATIVE
Influenza B by PCR: NEGATIVE
SARS Coronavirus 2 by RT PCR: NEGATIVE

## 2020-09-10 LAB — OB RESULTS CONSOLE GBS: GBS: NEGATIVE

## 2020-09-10 LAB — RPR: RPR Ser Ql: NONREACTIVE

## 2020-09-10 MED ORDER — OXYTOCIN-SODIUM CHLORIDE 30-0.9 UT/500ML-% IV SOLN
2.5000 [IU]/h | INTRAVENOUS | Status: DC
  Administered 2020-09-10: 2.5 [IU]/h via INTRAVENOUS

## 2020-09-10 MED ORDER — OXYCODONE-ACETAMINOPHEN 5-325 MG PO TABS: 2.0000 | ORAL_TABLET | ORAL | Status: DC | PRN

## 2020-09-10 MED ORDER — DIPHENHYDRAMINE HCL 50 MG/ML IJ SOLN: 12.5000 mg | INTRAMUSCULAR | Status: DC | PRN

## 2020-09-10 MED ORDER — TERBUTALINE SULFATE 1 MG/ML IJ SOLN: 0.2500 mg | Freq: Once | INTRAMUSCULAR | Status: DC | PRN

## 2020-09-10 MED ORDER — FENTANYL CITRATE (PF) 100 MCG/2ML IJ SOLN
INTRAMUSCULAR | Status: AC
  Administered 2020-09-10: 100 ug via EPIDURAL
  Filled 2020-09-10: qty 2

## 2020-09-10 MED ORDER — EPHEDRINE 5 MG/ML INJ: 10.0000 mg | INTRAVENOUS | Status: DC | PRN

## 2020-09-10 MED ORDER — LACTATED RINGERS IV SOLN: INTRAVENOUS | Status: DC

## 2020-09-10 MED ORDER — ONDANSETRON HCL 4 MG/2ML IJ SOLN: 4.0000 mg | Freq: Four times a day (QID) | INTRAMUSCULAR | Status: DC | PRN

## 2020-09-10 MED ORDER — FENTANYL CITRATE (PF) 100 MCG/2ML IJ SOLN: 100.0000 ug | Freq: Once | INTRAMUSCULAR | Status: AC

## 2020-09-10 MED ORDER — OXYTOCIN BOLUS FROM INFUSION
333.0000 mL | Freq: Once | INTRAVENOUS | Status: AC
  Administered 2020-09-10: 333 mL via INTRAVENOUS

## 2020-09-10 MED ORDER — BUTORPHANOL TARTRATE 1 MG/ML IJ SOLN: 1.0000 mg | INTRAMUSCULAR | Status: DC | PRN

## 2020-09-10 MED ORDER — PHENYLEPHRINE 40 MCG/ML (10ML) SYRINGE FOR IV PUSH (FOR BLOOD PRESSURE SUPPORT): 80.0000 ug | PREFILLED_SYRINGE | INTRAVENOUS | Status: DC | PRN

## 2020-09-10 MED ORDER — LACTATED RINGERS IV SOLN
500.0000 mL | Freq: Once | INTRAVENOUS | Status: AC
  Administered 2020-09-10: 500 mL via INTRAVENOUS

## 2020-09-10 MED ORDER — ACETAMINOPHEN 325 MG PO TABS
650.0000 mg | ORAL_TABLET | ORAL | Status: DC | PRN
  Administered 2020-09-10: 650 mg via ORAL
  Filled 2020-09-10: qty 2

## 2020-09-10 MED ORDER — LACTATED RINGERS IV SOLN
500.0000 mL | INTRAVENOUS | Status: DC | PRN
Start: 2020-09-10 — End: 2020-09-11

## 2020-09-10 MED ORDER — OXYTOCIN-SODIUM CHLORIDE 30-0.9 UT/500ML-% IV SOLN
1.0000 m[IU]/min | INTRAVENOUS | Status: DC
  Administered 2020-09-10: 2 m[IU]/min via INTRAVENOUS
  Filled 2020-09-10: qty 500

## 2020-09-10 MED ORDER — LIDOCAINE HCL (PF) 1 % IJ SOLN: 30.0000 mL | INTRAMUSCULAR | Status: DC | PRN

## 2020-09-10 MED ORDER — LIDOCAINE HCL (PF) 1 % IJ SOLN
INTRAMUSCULAR | Status: DC | PRN
  Administered 2020-09-10: 6 mL via EPIDURAL

## 2020-09-10 MED ORDER — SOD CITRATE-CITRIC ACID 500-334 MG/5ML PO SOLN: 30.0000 mL | ORAL | Status: DC | PRN

## 2020-09-10 MED ORDER — ZOLPIDEM TARTRATE 5 MG PO TABS: 5.0000 mg | ORAL_TABLET | Freq: Every evening | ORAL | Status: DC | PRN

## 2020-09-10 MED ORDER — FENTANYL-BUPIVACAINE-NACL 0.5-0.125-0.9 MG/250ML-% EP SOLN
12.0000 mL/h | EPIDURAL | Status: DC | PRN
  Filled 2020-09-10: qty 250

## 2020-09-10 MED ORDER — FENTANYL-BUPIVACAINE-NACL 0.5-0.125-0.9 MG/250ML-% EP SOLN
EPIDURAL | Status: DC | PRN
  Administered 2020-09-10: 12 mL/h via EPIDURAL

## 2020-09-10 MED ORDER — MISOPROSTOL 25 MCG QUARTER TABLET
25.0000 ug | ORAL_TABLET | ORAL | Status: DC | PRN
  Administered 2020-09-10: 25 ug via VAGINAL
  Filled 2020-09-10: qty 1

## 2020-09-10 MED ORDER — FENTANYL-BUPIVACAINE-NACL 0.5-0.125-0.9 MG/250ML-% EP SOLN
12.0000 mL/h | EPIDURAL | Status: DC | PRN
Start: 2020-09-10 — End: 2020-09-11
  Filled 2020-09-10: qty 250

## 2020-09-10 MED ORDER — HYDROXYZINE HCL 50 MG PO TABS: 50.0000 mg | ORAL_TABLET | Freq: Four times a day (QID) | ORAL | Status: DC | PRN

## 2020-09-10 MED ORDER — OXYCODONE-ACETAMINOPHEN 5-325 MG PO TABS: 1.0000 | ORAL_TABLET | ORAL | Status: DC | PRN

## 2020-09-10 NOTE — Progress Notes (Addendum)
2cm/70/-2 AROM blood-tinged fluid Start pitocin CTM BPs

## 2020-09-10 NOTE — Progress Notes (Signed)
4/c/0 IUPC with adequate contractions. Pitocin at 4 Continue

## 2020-09-10 NOTE — Progress Notes (Signed)
Doing well. Just arrived for two stage IOL - was called in late s/s L&D being full.  Still with mild HA but greatly improved and now "barely there". Visual changes have now resolved. SVE per RN 0.5/thick/-3, vertex. Cytotec. AROM/pitocin when more favorable.  GBS that was taken on 09/06/20 was negative (it resulted this morning).    Rosie Fate MD

## 2020-09-10 NOTE — Anesthesia Procedure Notes (Signed)
Epidural Patient location during procedure: OB Start time: 09/10/2020 1:01 PM End time: 09/10/2020 1:05 PM  Staffing Anesthesiologist: Bethena Midget, MD  Preanesthetic Checklist Completed: patient identified, IV checked, site marked, risks and benefits discussed, surgical consent, monitors and equipment checked, pre-op evaluation and timeout performed  Epidural Patient position: sitting Prep: DuraPrep and site prepped and draped Patient monitoring: continuous pulse ox and blood pressure Approach: midline Location: L3-L4 Injection technique: LOR air  Needle:  Needle type: Tuohy  Needle gauge: 17 G Needle length: 9 cm and 9 Needle insertion depth: 6 cm Catheter type: closed end flexible Catheter size: 19 Gauge Catheter at skin depth: 11 cm Test dose: negative  Assessment Events: blood not aspirated, injection not painful, no injection resistance, no paresthesia and negative IV test

## 2020-09-10 NOTE — Anesthesia Preprocedure Evaluation (Signed)
Anesthesia Evaluation  Patient identified by MRN, date of birth, ID band Patient awake    Reviewed: Allergy & Precautions, H&P , NPO status , Patient's Chart, lab work & pertinent test results, reviewed documented beta blocker date and time   Airway Mallampati: I  TM Distance: >3 FB Neck ROM: full    Dental no notable dental hx. (+) Teeth Intact, Dental Advisory Given   Pulmonary neg pulmonary ROS,    Pulmonary exam normal breath sounds clear to auscultation       Cardiovascular hypertension, Normal cardiovascular exam Rhythm:regular Rate:Normal     Neuro/Psych negative neurological ROS  negative psych ROS   GI/Hepatic negative GI ROS, Neg liver ROS,   Endo/Other  diabetes, Gestational  Renal/GU negative Renal ROS  negative genitourinary   Musculoskeletal   Abdominal   Peds  Hematology negative hematology ROS (+)   Anesthesia Other Findings   Reproductive/Obstetrics (+) Pregnancy                             Anesthesia Physical Anesthesia Plan  ASA: III  Anesthesia Plan: Epidural   Post-op Pain Management:    Induction:   PONV Risk Score and Plan:   Airway Management Planned:   Additional Equipment:   Intra-op Plan:   Post-operative Plan:   Informed Consent: I have reviewed the patients History and Physical, chart, labs and discussed the procedure including the risks, benefits and alternatives for the proposed anesthesia with the patient or authorized representative who has indicated his/her understanding and acceptance.       Plan Discussed with:   Anesthesia Plan Comments:         Anesthesia Quick Evaluation

## 2020-09-10 NOTE — Progress Notes (Signed)
C/c/+2 push 

## 2020-09-11 ENCOUNTER — Encounter (HOSPITAL_COMMUNITY): Payer: Self-pay | Admitting: Obstetrics and Gynecology

## 2020-09-11 LAB — CBC
HCT: 34.2 % — ABNORMAL LOW (ref 36.0–46.0)
HCT: 35.4 % — ABNORMAL LOW (ref 36.0–46.0)
Hemoglobin: 11.6 g/dL — ABNORMAL LOW (ref 12.0–15.0)
Hemoglobin: 12.3 g/dL (ref 12.0–15.0)
MCH: 29.2 pg (ref 26.0–34.0)
MCH: 29.4 pg (ref 26.0–34.0)
MCHC: 33.9 g/dL (ref 30.0–36.0)
MCHC: 34.7 g/dL (ref 30.0–36.0)
MCV: 84.7 fL (ref 80.0–100.0)
MCV: 86.1 fL (ref 80.0–100.0)
Platelets: 165 10*3/uL (ref 150–400)
Platelets: 179 10*3/uL (ref 150–400)
RBC: 3.97 MIL/uL (ref 3.87–5.11)
RBC: 4.18 MIL/uL (ref 3.87–5.11)
RDW: 12.6 % (ref 11.5–15.5)
RDW: 12.9 % (ref 11.5–15.5)
WBC: 14.3 10*3/uL — ABNORMAL HIGH (ref 4.0–10.5)
WBC: 16 10*3/uL — ABNORMAL HIGH (ref 4.0–10.5)
nRBC: 0 % (ref 0.0–0.2)
nRBC: 0 % (ref 0.0–0.2)

## 2020-09-11 MED ORDER — PRENATAL MULTIVITAMIN CH
1.0000 | ORAL_TABLET | Freq: Every day | ORAL | Status: DC
  Administered 2020-09-11 – 2020-09-12 (×2): 1 via ORAL
  Filled 2020-09-11 (×2): qty 1

## 2020-09-11 MED ORDER — SENNOSIDES-DOCUSATE SODIUM 8.6-50 MG PO TABS
2.0000 | ORAL_TABLET | ORAL | Status: DC
  Administered 2020-09-12: 2 via ORAL
  Filled 2020-09-11: qty 2

## 2020-09-11 MED ORDER — OXYCODONE HCL 5 MG PO TABS: 5.0000 mg | ORAL_TABLET | ORAL | Status: DC | PRN

## 2020-09-11 MED ORDER — WITCH HAZEL-GLYCERIN EX PADS
1.0000 | MEDICATED_PAD | CUTANEOUS | Status: DC | PRN
Start: 2020-09-10 — End: 2020-09-12

## 2020-09-11 MED ORDER — ZOLPIDEM TARTRATE 5 MG PO TABS: 5.0000 mg | ORAL_TABLET | Freq: Every evening | ORAL | Status: DC | PRN

## 2020-09-11 MED ORDER — OXYCODONE HCL 5 MG PO TABS: 10.0000 mg | ORAL_TABLET | ORAL | Status: DC | PRN

## 2020-09-11 MED ORDER — ACETAMINOPHEN 325 MG PO TABS: 650.0000 mg | ORAL_TABLET | ORAL | Status: DC | PRN

## 2020-09-11 MED ORDER — TETANUS-DIPHTH-ACELL PERTUSSIS 5-2.5-18.5 LF-MCG/0.5 IM SUSY: 0.5000 mL | PREFILLED_SYRINGE | Freq: Once | INTRAMUSCULAR | Status: DC

## 2020-09-11 MED ORDER — DIPHENHYDRAMINE HCL 25 MG PO CAPS
25.0000 mg | ORAL_CAPSULE | Freq: Four times a day (QID) | ORAL | Status: DC | PRN
Start: 2020-09-11 — End: 2020-09-12

## 2020-09-11 MED ORDER — ONDANSETRON HCL 4 MG PO TABS: 4.0000 mg | ORAL_TABLET | ORAL | Status: DC | PRN

## 2020-09-11 MED ORDER — IBUPROFEN 600 MG PO TABS
600.0000 mg | ORAL_TABLET | Freq: Four times a day (QID) | ORAL | Status: DC
  Administered 2020-09-11 – 2020-09-12 (×6): 600 mg via ORAL
  Filled 2020-09-11 (×6): qty 1

## 2020-09-11 MED ORDER — BENZOCAINE-MENTHOL 20-0.5 % EX AERO
1.0000 "application " | INHALATION_SPRAY | CUTANEOUS | Status: DC | PRN
  Filled 2020-09-11: qty 56

## 2020-09-11 MED ORDER — COCONUT OIL OIL
1.0000 "application " | TOPICAL_OIL | Status: DC | PRN
  Administered 2020-09-11: 1 via TOPICAL

## 2020-09-11 MED ORDER — ONDANSETRON HCL 4 MG/2ML IJ SOLN: 4.0000 mg | INTRAMUSCULAR | Status: DC | PRN

## 2020-09-11 MED ORDER — DIBUCAINE (PERIANAL) 1 % EX OINT: 1.0000 "application " | TOPICAL_OINTMENT | CUTANEOUS | Status: DC | PRN

## 2020-09-11 MED ORDER — SIMETHICONE 80 MG PO CHEW
80.0000 mg | CHEWABLE_TABLET | ORAL | Status: DC | PRN
Start: 2020-09-11 — End: 2020-09-12

## 2020-09-11 NOTE — Lactation Note (Signed)
This note was copied from a baby's chart. Lactation Consultation Note  Patient Name: Kristi Brewer [id number] Date: 09/11/2020 Reason for consult: L&D Initial assessment;1st time breastfeeding;Early term 37-38.6wks Age:26 hours P1, ETI female infant. LC entered room in L&D, mom was doing STS. Mom attempt latch infant on her right breast using the cradle hold, infant did not sustain latch and repeated came off the breast. LC did little suck training with infant but infant was fussy and not latching at breast, mom did STS afterwards infant appeared calmer. Infant was given 6 mls of colostrum by spoon. Mom will continue to work towards latching infant at the breast and will ask for assistance from RN and LC on MBU. Mom knows to breastfeed infant according to primal cues, licking, smacking , tasting, rooting and hands or fist in mouth. LC discussed infant's in put and output with parents. Mom knows to hand express and give back EBM  if infant continue not to latch. Mom knows infant is learning how to breastfeed.    Maternal Data Has patient been taught Hand Expression?: Yes Does the patient have breastfeeding experience prior to this delivery?: No  Feeding Mother's Current Feeding Choice: Breast Milk  LATCH Score Latch: Too sleepy or reluctant, no latch achieved, no sucking elicited.  Audible Swallowing: None  Type of Nipple: Everted at rest and after stimulation  Comfort (Breast/Nipple): Soft / non-tender  Hold (Positioning): Assistance needed to correctly position infant at breast and maintain latch.  LATCH Score: 5   Lactation Tools Discussed/Used    Interventions Interventions: Assisted with latch;Skin to skin;Hand express;Breast compression;Adjust position;Support pillows;Position options;Expressed milk  Discharge Pump: Personal WIC Program: No  Consult Status Consult Status: Follow-up Date: 09/11/20 Follow-up type: In-patient    Danelle Earthly 09/11/2020,  12:42 AM

## 2020-09-11 NOTE — Progress Notes (Signed)
Post Partum Day 1 Subjective: no complaints, up ad lib, voiding and tolerating PO  Objective: Blood pressure 123/83, pulse 92, temperature 98.4 F (36.9 C), temperature source Oral, resp. rate 18, height 5\' 6"  (1.676 m), weight 88.3 kg, last menstrual period 12/18/2019, SpO2 97 %, unknown if currently breastfeeding.  Physical Exam:  General: alert Lochia: appropriate Uterine Fundus: firm Incision: n/a DVT Evaluation: No evidence of DVT seen on physical exam.  Recent Labs    09/10/20 2359 09/11/20 0336  HGB 11.6* 12.3  HCT 34.2* 35.4*    Assessment/Plan: Plan for discharge tomorrow and Breastfeeding   LOS: 1 day   11/11/20 09/11/2020, 8:35 AM

## 2020-09-11 NOTE — Anesthesia Postprocedure Evaluation (Signed)
Anesthesia Post Note  Patient: GEMINI BUNTE  Procedure(s) Performed: AN AD HOC LABOR EPIDURAL     Patient location during evaluation: Mother Baby Anesthesia Type: Epidural Level of consciousness: awake and alert Pain management: pain level controlled Vital Signs Assessment: post-procedure vital signs reviewed and stable Respiratory status: spontaneous breathing, nonlabored ventilation and respiratory function stable Cardiovascular status: stable Postop Assessment: no headache, no backache, epidural receding, no apparent nausea or vomiting, patient able to bend at knees, adequate PO intake and able to ambulate Anesthetic complications: no   No complications documented.  Last Vitals:  Vitals:   09/11/20 0250 09/11/20 0606  BP: 111/71 123/83  Pulse: 78 92  Resp: 18   Temp:  36.9 C  SpO2: 98% 97%    Last Pain:  Vitals:   09/11/20 0607  TempSrc:   PainSc: 4    Pain Goal: Patients Stated Pain Goal: 4 (09/10/20 1550)                 Laban Emperor

## 2020-09-11 NOTE — Lactation Note (Signed)
This note was copied from a baby's chart. Lactation Consultation Note  Patient Name: Kristi Brewer [id number] Date: 09/11/2020 Reason for consult: Follow-up assessment;Early term 37-38.6wks;1st time breastfeeding;Primapara;Maternal endocrine disorder Age:26 hours  Infant held in cradle position with lactating patient wrapped in blanket upon entry.  Patient informed [facility] student that she had just fed for about 30 minutes on Right breast.   Patient states that feeding infant at breast improved during the earlier 8 o'clock feed and denies any nipple or breast soreness/pain.  Patient denies any noticeable filling of breast at this time, but states being able to easily hand express colostrum.    [facility] student educated patient on feeding cues, supply & demand, unwrapping infant during feedings to obtain a deeper latch, breast changes, milk transition, stool transition color, infant belly size, benefits of colostrum, and benefits of STS to help maintain infants body temperature and glucose levels.    LC provided patient with BF pamphlet.    Infant began cueing during diaper change.  LC student instructed patient to bring infant to breast following diaper change.    Infant held in Cross-Cradle position on Left breast.  LC student assisted with positioning and latching infant.  Audible swallows were heard.  [facility] student educated on massaging breast and compressions during feed to keep infant woke and engaged during feeding.  Infant fed for 20 mins. Patient denies any additional questions at this time.   Plan 1. Continue to do STS 2. Watch for cues, feed on demand 8-12x within 24 hours 3. Hand express a few drops before latching infant to breast 4. Breast compressions during feeding to keep infant woke and engaged during feed.   Maternal Data Has patient been taught Hand Expression?: Yes Does the patient have breastfeeding experience prior to this delivery?: No  Feeding Mother's Current Feeding  Choice: Breast Milk  LATCH Score Latch: Grasps breast easily, tongue down, lips flanged, rhythmical sucking.  Audible Swallowing: A few with stimulation  Type of Nipple: Everted at rest and after stimulation  Comfort (Breast/Nipple): Soft / non-tender  Hold (Positioning): Assistance needed to correctly position infant at breast and maintain latch.  LATCH Score: 8   Lactation Tools Discussed/Used    Interventions Interventions: Skin to skin;Hand express;Breast massage;Assisted with latch;Breast feeding basics reviewed;Breast compression;Adjust position;Education  Discharge Pump: Personal (Motif DEBP at home) [facility] Program: No  Consult Status Consult Status: Follow-up Date: 09/12/20 Follow-up type: In-patient    Scarlette Ar 09/11/2020, 2:38 PM

## 2020-09-12 NOTE — Lactation Note (Addendum)
This note was copied from a baby's chart. Lactation Consultation Note  Patient Name: Kristi Brewer [id number] Date: 09/12/2020 Reason for consult: Follow-up assessment Age:26 hours   Arrived in mother's room just after a feeding mother reports that she breastfed 15 mins on each side. She reports that she is seeing infant swallow and that she thinks infant is breastfeeding well.  Infants discharge is being canceled due to jaundice level .  Suggested that mother hand express, use pump  and start giving infant extra volume after feeding.  Parents were given the LPI handout and reviewed stating that some of the information was the same for the ETI. Parents attentive.  Staff nurse sat up the DEBP at the bedside for mother . She plans to start using the pump q 3 hours for 15 mins.   Encouraged to continue to cue base feeding infant.  Mother receptive to plan.    Maternal Data    Feeding Mother's Current Feeding Choice: Breast Milk  LATCH Score                    Lactation Tools Discussed/Used    Interventions    Discharge    Consult Status Consult Status: Follow-up Date: 09/13/20 Follow-up type: In-patient    Stevan Born [facility] 09/12/2020, 2:57 PM

## 2020-09-12 NOTE — Discharge Summary (Signed)
Postpartum Discharge Summary  Date of Service updated 09/12/20     Patient Name: Kristi Brewer DOB: 1995/08/30 MRN: [id number]  Date of admission: 09/10/2020 Delivery date:09/10/2020  Delivering provider: Tyson Dense  Date of discharge: 09/12/2020  Admitting diagnosis: Gestational hypertension [O13.9] Intrauterine pregnancy: [redacted]w[redacted]d    Secondary diagnosis:  Active Problems:   Gestational hypertension  Additional problems: none    Discharge diagnosis: Term Pregnancy Delivered and Gestational Hypertension                                              Post partum procedures:none Augmentation: AROM, Pitocin and Cytotec Complications: None  Hospital course: Induction of Labor With Vaginal Delivery   25y.o. yo G1P1001 at 34w1das admitted to the hospital 09/10/2020 for induction of labor.  Indication for induction: Gestational hypertension.  Patient had an uncomplicated labor course as follows: Membrane Rupture Time/Date: 11:50 AM ,09/10/2020   Delivery Method:Vaginal, Spontaneous  Episiotomy: None  Lacerations:  2nd degree  Details of delivery can be found in separate delivery note.  Patient had a routine postpartum course. Patient is discharged home 09/12/20.  Newborn Data: Birth date:09/10/2020  Birth time:11:16 PM  Gender:Female  Living status:Living  Apgars:9 ,9  Weight:3229 g   Magnesium Sulfate received: No BMZ received: No Rhophylac:N/A MMR:N/A T-DaP:Given prenatally Flu: N/A Transfusion:No  Physical exam  Vitals:   09/11/20 0606 09/11/20 1001 09/11/20 1450 09/11/20 2048  BP: 123/83 127/84 120/84 109/74  Pulse: 92 65 68 74  Resp:  _0 Temp: 98.4 F (36.9 C) 98.2 F (36.8 C) 98.4 F (36.9 C) 98.1 F (36.7 C)  TempSrc: Oral Oral Oral Oral  SpO2: 97% 98% 100% 100%  Weight:      Height:       General: alert Lochia: appropriate Uterine Fundus: firm Incision: N/A DVT Evaluation: No evidence of DVT seen on physical exam. Labs: Lab  Results  Component Value Date   WBC 16.0 (H) 09/11/2020   HGB 12.3 09/11/2020   HCT 35.4 (L) 09/11/2020   MCV 84.7 09/11/2020   PLT 165 09/11/2020   CMP Latest Ref Rng & Units 09/08/2020  Glucose 70 - 99 mg/dL 116(H)  BUN 6 - 20 mg/dL 10  Creatinine 0.44 - 1.00 mg/dL 0.64  Sodium 135 - 145 mmol/L 133(L)  Potassium 3.5 - 5.1 mmol/L 3.4(L)  Chloride 98 - 111 mmol/L 100  CO2 22 - 32 mmol/L 24  Calcium 8.9 - 10.3 mg/dL 9.4  Total Protein 6.5 - 8.1 g/dL 6.1(L)  Total Bilirubin 0.3 - 1.2 mg/dL 1.0  Alkaline Phos 38 - 126 U/L 185(H)  AST 15 - 41 U/L 28  ALT 0 - 44 U/L 24   Edinburgh Score: Edinburgh Postnatal Depression Scale Screening Tool 09/11/2020  I have been able to laugh and see the funny side of things. 0  I have looked forward with enjoyment to things. 0  I have blamed myself unnecessarily when things went wrong. 0  I have been anxious or worried for no good reason. 0  I have felt scared or panicky for no good reason. 0  Things have been getting on top of me. 0  I have been so unhappy that I have had difficulty sleeping. 0  I have felt sad or miserable. 0  I have been so unhappy that I have  been crying. 0  The thought of harming myself has occurred to me. 0  Edinburgh Postnatal Depression Scale Total 0      After visit meds:  Allergies as of 09/12/2020      Reactions   Pear Swelling      Medication List    STOP taking these medications   loperamide 2 MG capsule Commonly known as: IMODIUM     TAKE these medications   acetaminophen 500 MG tablet Commonly known as: TYLENOL Take 500 mg by mouth every 6 (six) hours as needed for mild pain.   prenatal multivitamin Tabs tablet Take 1 tablet by mouth daily at 12 noon.        Discharge home in stable condition Infant Feeding: Bottle and Breast Infant Disposition:home with mother Discharge instruction: per After Visit Summary and Postpartum booklet. Activity: Advance as tolerated. Pelvic rest for 6 weeks.   Diet: routine diet Anticipated Birth Control: Unsure Postpartum Appointment:6 weeks/ Pt has BP cuff at home and parameters/PIH s/s given Additional Postpartum F/U: n/a Future Appointments: Future Appointments  Date Time Provider [place]  11/10/2020  3:00 PM Salvadore Dom, MD [facility] None   Follow up Visit:      09/12/2020 Tyson Dense, MD

## 2020-09-13 ENCOUNTER — Ambulatory Visit: Payer: Self-pay

## 2020-09-13 NOTE — Lactation Note (Signed)
This note was copied from a baby's chart. Lactation Consultation Note  Patient Name: Kristi Brewer Date: 09/13/2020 Reason for consult: Follow-up assessment;Early term 37-38.6wks;Primapara;1st time breastfeeding Age:26 hours  P1 mother whose infant is now 33 hours old.  This is an ETI at 37+1 weeks.  Baby has a 10% weight loss this morning and bilirubin level is 11.8 mg/dl at 48 hours of life.  Due to weight loss and increased bilirubin level I offered to assist mother with breast feeding.  Mother agreeable.  Mother stated that baby has been breast feeding well.  Mother interested in doing the cradle hold.  I suggested the cross cradle hold for better support.  Mother willing to try.  Asked her to express colostrum and finger fed baby a few drops prior to latching.  Assisted to latch fairly easily and baby began sucking.  Educated mother on a good deep latch and demonstrated breast compressions.  Baby sucked for a few minutes, became irritated and pushed back off the breast.  Burped her and she passed gas.  Assisted to latch again.  With constant stimulation she began sucking rhythmically with good jaw extensions.  She did frequent burping and passed gas often.  After 25 minutes she became very sleepy.  Suggested father do STS while mother pumps with the DEBP.  Observed mother pumping and educated about the pump.  Assisted to lubricate flanges and nipples/areolas prior to pumping.  Mother has bruising, irritation and compression stripes to nipples.  She is tender and sensitive but did not complain of pain during the feeding.  Provided comfort gels with instructions for use.  Mother will feed back any EBM obtained.  Discussed supplementation options with mother; RN to follow up.    Asked mother to call me for further assistance.   Maternal Data Has patient been taught Hand Expression?: Yes Does the patient have breastfeeding experience prior to this delivery?: No  Feeding Mother's  Current Feeding Choice: Breast Milk  LATCH Score Latch: Grasps breast easily, tongue down, lips flanged, rhythmical sucking.  Audible Swallowing: Spontaneous and intermittent  Type of Nipple: Everted at rest and after stimulation  Comfort (Breast/Nipple): Filling, red/small blisters or bruises, mild/mod discomfort  Hold (Positioning): Assistance needed to correctly position infant at breast and maintain latch.  LATCH Score: 8   Lactation Tools Discussed/Used Tools: Pump;Flanges;Coconut oil;Comfort gels Flange Size: 24 Breast pump type: Double-Electric Breast Pump;Manual (Reviewed) Pump Education: Setup, frequency, and cleaning (Reviewed)  Interventions Interventions: Breast feeding basics reviewed;Assisted with latch;Skin to skin;Breast massage;Hand express;Breast compression;Adjust position;DEBP;Comfort gels;Hand pump;Coconut oil;Position options;Support pillows;Education  Discharge Pump: DEBP;Manual;Personal  Consult Status Consult Status: Follow-up Date: 09/14/20 Follow-up type: In-patient    Kristi Brewer 09/13/2020, 9:00 AM

## 2020-09-14 ENCOUNTER — Ambulatory Visit: Payer: Self-pay

## 2020-09-14 NOTE — Lactation Note (Signed)
This note was copied from a baby's chart. Lactation Consultation Note  Patient Name: Kristi Brewer [id number] Date: 09/14/2020 Reason for consult: Follow-up assessment;Primapara;Hyperbilirubinemia;Early term 37-38.6wks  Infant has gained 69g over the last 24 hrs. Mom is comfortable with breastfeeding and pumping. Mom is offering the breast first and they are offering a bottle until infant is content. Per Mom, her milk has come to volume & she recently expressed 100 mL.   Dad inquired about feeding volumes for an infant when not going to the breast. I gave them the sheet, "Formula Feeding Your Baby" for purposes of volumes. Parents said that infant is doing better with the Enfamil Extra-Slow Flow nipple.   Mom knows she can turn up suction on DEBP. She is currently comfortable with the size 27 flanges.   All of parents' questions were answered to their satisfaction.    Lurline Hare [facility] 09/14/2020, 3:13 PM

## 2020-09-30 ENCOUNTER — Inpatient Hospital Stay (HOSPITAL_COMMUNITY): Admit: 2020-09-30 | Payer: Self-pay

## 2020-11-10 ENCOUNTER — Ambulatory Visit: Payer: BC Managed Care – PPO | Admitting: Obstetrics and Gynecology

## 2021-04-18 ENCOUNTER — Other Ambulatory Visit: Payer: Self-pay

## 2021-04-18 ENCOUNTER — Emergency Department
Admission: EM | Admit: 2021-04-18 | Discharge: 2021-04-18 | Disposition: A | Discharge status: Home / Self Care | Payer: BC Managed Care – PPO | Source: Home / Self Care

## 2021-04-18 DIAGNOSIS — K429 Umbilical hernia without obstruction or gangrene: Secondary | ICD-10-CM

## 2021-04-18 NOTE — ED Provider Notes (Signed)
[facility]    CSN: [id number] Arrival date & time: 04/18/21  1736      History   Chief Complaint Chief Complaint  Patient presents with   Umbilical Hernia    HPI Kristi Brewer is a 25 y.o. female.   HPI 25 year old female presents with possible umbilical hernia which she was born with.  Patient is currently 7 months postpartum and was advised by OB to come here for a scan.  PMH significant for hernia of abdominal cavity.  Past Medical History:  Diagnosis Date   Hernia of abdominal cavity     Patient Active Problem List   Diagnosis Date Noted   Gestational hypertension 09/10/2020   Gestational diabetes mellitus (GDM), antepartum 07/14/2020    Past Surgical History:  Procedure Laterality Date   ADENOIDECTOMY     TYMPANOSTOMY TUBE PLACEMENT      OB History     Gravida  1   Para  1   Term  1   Preterm  0   AB  0   Living  1      SAB  0   IAB  0   Ectopic  0   Multiple  0   Live Births  1            Home Medications    Prior to Admission medications   Medication Sig Start Date End Date Taking? Authorizing Provider  acetaminophen (TYLENOL) 500 MG tablet Take 500 mg by mouth every 6 (six) hours as needed for mild pain.    [provider]  INCASSIA 0.35 MG tablet Take 1 tablet by mouth daily. 02/06/21   [provider]  Prenatal Vit-Fe Fumarate-FA (PRENATAL MULTIVITAMIN) TABS tablet Take 1 tablet by mouth daily at 12 noon.    [provider]    Family History Family History  Problem Relation Age of Onset   Diabetes Mother     Social History Social History   Tobacco Use   Smoking status: Never   Smokeless tobacco: Never  Vaping Use   Vaping Use: Never used  Substance Use Topics   Alcohol use: Not Currently    Alcohol/week: 2.0 standard drinks    Types: 2 Standard drinks or equivalent per week    Comment: not while pregnany   Drug use: No     Allergies   Pear   Review of  Systems Review of Systems  Gastrointestinal:  Positive for abdominal pain.    Physical Exam Triage Vital Signs ED Triage Vitals  Enc Vitals Group     BP 04/18/21 1752 124/83     Pulse Rate 04/18/21 1752 72     Resp 04/18/21 1752 18     Temp 04/18/21 1752 97.9 F (36.6 C)     Temp Source 04/18/21 1752 Oral     SpO2 04/18/21 1752 99 %     Weight --      Height --      Head Circumference --      Peak Flow --      Pain Score 04/18/21 1753 8     Pain Loc --      Pain Edu? --      Excl. in GC? --    No data found.  Updated Vital Signs BP 124/83 (BP Location: Left Arm)   Pulse 72   Temp 97.9 F (36.6 C) (Oral)   Resp 18   SpO2 99%   Breastfeeding Yes   Physical Exam  Vitals and nursing note reviewed.  Constitutional:      Appearance: Normal appearance.  HENT:     Head: Normocephalic and atraumatic.     Mouth/Throat:     Mouth: Mucous membranes are moist.     Pharynx: Oropharynx is clear.  Eyes:     Extraocular Movements: Extraocular movements intact.     Pupils: Pupils are equal, round, and reactive to light.  Cardiovascular:     Rate and Rhythm: Normal rate and regular rhythm.     Pulses: Normal pulses.     Heart sounds: Normal heart sounds.  Pulmonary:     Effort: Pulmonary effort is normal.     Breath sounds: Normal breath sounds.     Comments: No adventitious breath sounds noted Abdominal:     General: Bowel sounds are normal. There is no distension.     Palpations: Abdomen is soft.     Tenderness: There is abdominal tenderness. There is no right CVA tenderness, left CVA tenderness, guarding or rebound.     Hernia: A hernia is present.     Comments: Probable umbilical hernia, just superior to umbilicus, TTP  Musculoskeletal:        General: Normal range of motion.     Cervical back: Normal range of motion and neck supple.  Skin:    General: Skin is warm and dry.  Neurological:     General: No focal deficit present.     Mental Status: She is alert and  oriented to person, place, and time. Mental status is at baseline.  Psychiatric:        Mood and Affect: Mood normal.        Behavior: Behavior normal.        Thought Content: Thought content normal.     UC Treatments / Results  Labs (all labs ordered are listed, but only abnormal results are displayed) Labs Reviewed - No data to display  EKG   Radiology No results found.  Procedures Procedures (including critical care time)  Medications Ordered in UC Medications - No data to display  Initial Impression / Assessment and Plan / UC Course  I have reviewed the triage vital signs and the nursing notes.  Pertinent labs & imaging results that were available during my care of the patient were reviewed by me and considered in my medical decision making (see chart for details).     MDM: 1.  Umbilical hernia without obstruction and without gangrene-Advised/instructed patient to follow-up with OB/GYN for either ultrasound of abdomen or CT of abdomen and pelvis.  Advised patient may come back here during the day (Monday through Friday 8 AM-3:30 PM to ensure imaging can be performed here).  Patient discharged home, hemodynamically stable. Final Clinical Impressions(s) / UC Diagnoses   Final diagnoses:  Umbilical hernia without obstruction and without gangrene     Discharge Instructions      Advised/instructed patient to follow-up with OB/GYN for either ultrasound of abdomen or CT of abdomen and pelvis.  Advised patient may come back here during the day (Monday through Friday 8 AM-3:30 PM to ensure imaging can be performed here).     ED Prescriptions   None    PDMP not reviewed this encounter.   Trevor Iha, FNP 04/18/21 [phone]

## 2021-04-18 NOTE — ED Triage Notes (Addendum)
Pt c/o pain around her umbilical hernia (born with) that started Saturday. No known injury. Pt is currently 7 months post partum. Pt called OB today and was referred to [facility] for possible scan.

## 2021-04-18 NOTE — Discharge Instructions (Addendum)
Advised/instructed patient to follow-up with OB/GYN for either ultrasound of abdomen or CT of abdomen and pelvis.  Advised patient may come back here during the day (Monday through Friday 8 AM-3:30 PM to ensure imaging can be performed here).

## 2021-04-19 ENCOUNTER — Emergency Department (HOSPITAL_COMMUNITY): Payer: BC Managed Care – PPO

## 2021-04-19 ENCOUNTER — Emergency Department (HOSPITAL_COMMUNITY)
Admission: EM | Admit: 2021-04-19 | Discharge: 2021-04-19 | Disposition: A | Discharge status: Home / Self Care | Payer: BC Managed Care – PPO | Attending: Emergency Medicine | Admitting: Emergency Medicine

## 2021-04-19 ENCOUNTER — Encounter (HOSPITAL_COMMUNITY): Payer: Self-pay | Admitting: Emergency Medicine

## 2021-04-19 DIAGNOSIS — R1084 Generalized abdominal pain: Secondary | ICD-10-CM | POA: Diagnosis not present

## 2021-04-19 DIAGNOSIS — R109 Unspecified abdominal pain: Secondary | ICD-10-CM | POA: Diagnosis present

## 2021-04-19 LAB — COMPREHENSIVE METABOLIC PANEL
ALT: 24 U/L (ref 0–44)
AST: 24 U/L (ref 15–41)
Albumin: 4.4 g/dL (ref 3.5–5.0)
Alkaline Phosphatase: 64 U/L (ref 38–126)
Anion gap: 10 (ref 5–15)
BUN: 16 mg/dL (ref 6–20)
CO2: 23 mmol/L (ref 22–32)
Calcium: 9.8 mg/dL (ref 8.9–10.3)
Chloride: 104 mmol/L (ref 98–111)
Creatinine, Ser: 0.86 mg/dL (ref 0.44–1.00)
GFR, Estimated: >60 mL/min (ref >60)
Glucose, Bld: 111 mg/dL — ABNORMAL HIGH (ref 70–99)
Potassium: 3.8 mmol/L (ref 3.5–5.1)
Sodium: 137 mmol/L (ref 135–145)
Total Bilirubin: 1.9 mg/dL — ABNORMAL HIGH (ref 0.3–1.2)
Total Protein: 7.5 g/dL (ref 6.5–8.1)

## 2021-04-19 LAB — CBC
HCT: 43 % (ref 36.0–46.0)
Hemoglobin: 14.5 g/dL (ref 12.0–15.0)
MCH: 29.2 pg (ref 26.0–34.0)
MCHC: 33.7 g/dL (ref 30.0–36.0)
MCV: 86.5 fL (ref 80.0–100.0)
Platelets: 272 10*3/uL (ref 150–400)
RBC: 4.97 MIL/uL (ref 3.87–5.11)
RDW: 12.9 % (ref 11.5–15.5)
WBC: 6 10*3/uL (ref 4.0–10.5)
nRBC: 0 % (ref 0.0–0.2)

## 2021-04-19 LAB — URINALYSIS, ROUTINE W REFLEX MICROSCOPIC
Bilirubin Urine: NEGATIVE
Glucose, UA: NEGATIVE mg/dL
Hgb urine dipstick: NEGATIVE
Ketones, ur: 5 mg/dL — AB
Leukocytes,Ua: NEGATIVE
Nitrite: NEGATIVE
Protein, ur: NEGATIVE mg/dL
Specific Gravity, Urine: 1.016 (ref 1.005–1.030)
pH: 5 (ref 5.0–8.0)

## 2021-04-19 LAB — LIPASE, BLOOD: Lipase: 29 U/L (ref 11–51)

## 2021-04-19 LAB — I-STAT BETA HCG BLOOD, ED (MC, WL, AP ONLY): I-stat hCG, quantitative: <5 m[IU]/mL (ref <5)

## 2021-04-19 MED ORDER — FENTANYL CITRATE PF 50 MCG/ML IJ SOSY
50.0000 ug | PREFILLED_SYRINGE | Freq: Once | INTRAMUSCULAR | Status: DC
Start: 2021-04-19 — End: 2021-04-19

## 2021-04-19 MED ORDER — SODIUM CHLORIDE 0.9 % IV BOLUS
1000.0000 mL | Freq: Once | INTRAVENOUS | Status: AC
  Administered 2021-04-19: 1000 mL via INTRAVENOUS

## 2021-04-19 MED ORDER — IOHEXOL 350 MG/ML SOLN
80.0000 mL | Freq: Once | INTRAVENOUS | Status: AC | PRN
  Administered 2021-04-19: 80 mL via INTRAVENOUS

## 2021-04-19 NOTE — ED Provider Notes (Addendum)
[facility] EMERGENCY DEPARTMENT Provider Note   CSN: [id number] Arrival date & time: 04/19/21  9622     History Chief Complaint  Patient presents with   Hernia   Abdominal Pain    Kristi Brewer is a 25 y.o. female reports that she was told that when she was born she had an umbilical hernia.  She reports that is never caused her any issues before.  Patient reports that 1 week ago she developed pain around her umbilicus, she describes it as sharp moderate intensity constant nonradiating no clear aggravating or alleviating factors.  Patient has not taken any medications for her symptoms prior to arrival.  Patient reports that she was sent to an urgent care yesterday for a CT scan however they did not have it available there so she came here for evaluation.  Patient reports pain is associate with a small amount of nonbloody diarrhea.  Denies fever, chills, injury/trauma, chest pain, nausea, vomiting, melena, hematochezia or any additional concerns today.  HPI     Past Medical History:  Diagnosis Date   Hernia of abdominal cavity     Patient Active Problem List   Diagnosis Date Noted   Gestational hypertension 09/10/2020   Gestational diabetes mellitus (GDM), antepartum 07/14/2020    Past Surgical History:  Procedure Laterality Date   ADENOIDECTOMY     TYMPANOSTOMY TUBE PLACEMENT       OB History     Gravida  1   Para  1   Term  1   Preterm  0   AB  0   Living  1      SAB  0   IAB  0   Ectopic  0   Multiple  0   Live Births  1           Family History  Problem Relation Age of Onset   Diabetes Mother     Social History   Tobacco Use   Smoking status: Never   Smokeless tobacco: Never  Vaping Use   Vaping Use: Never used  Substance Use Topics   Alcohol use: Not Currently    Alcohol/week: 2.0 standard drinks    Types: 2 Standard drinks or equivalent per week    Comment: not while pregnany   Drug use: No    Home  Medications Prior to Admission medications   Medication Sig Start Date End Date Taking? Authorizing Provider  acetaminophen (TYLENOL) 500 MG tablet Take 500 mg by mouth every 6 (six) hours as needed for mild pain.    [provider]  INCASSIA 0.35 MG tablet Take 1 tablet by mouth daily. 02/06/21   [provider]  Prenatal Vit-Fe Fumarate-FA (PRENATAL MULTIVITAMIN) TABS tablet Take 1 tablet by mouth daily at 12 noon.    [provider]    Allergies    Pear  Review of Systems   Review of Systems Ten systems are reviewed and are negative for acute change except as noted in the HPI  Physical Exam Updated Vital Signs BP 104/69 (BP Location: Right Arm)   Pulse 70   Temp 97.9 F (36.6 C)   Resp 16   SpO2 100%   Physical Exam Constitutional:      General: She is not in acute distress.    Appearance: Normal appearance. She is well-developed. She is not ill-appearing or diaphoretic.  HENT:     Head: Normocephalic and atraumatic.  Eyes:     General: Vision grossly  intact. Gaze aligned appropriately.     Pupils: Pupils are equal, round, and reactive to light.  Neck:     Trachea: Trachea and phonation normal.  Pulmonary:     Effort: Pulmonary effort is normal. No respiratory distress.  Abdominal:     General: There is no distension.     Palpations: Abdomen is soft.     Tenderness: There is abdominal tenderness in the periumbilical area. There is no guarding or rebound.  Musculoskeletal:        General: Normal range of motion.     Cervical back: Normal range of motion.  Skin:    General: Skin is warm and dry.  Neurological:     Mental Status: She is alert.     GCS: GCS eye subscore is 4. GCS verbal subscore is 5. GCS motor subscore is 6.     Comments: Speech is clear and goal oriented, follows commands Major Cranial nerves without deficit, no facial droop Moves extremities without ataxia, coordination intact  Psychiatric:        Behavior: Behavior  normal.    ED Results / Procedures / Treatments   Labs (all labs ordered are listed, but only abnormal results are displayed) Labs Reviewed  COMPREHENSIVE METABOLIC PANEL - Abnormal; Notable for the following components:      Result Value   Glucose, Bld 111 (*)    Total Bilirubin 1.9 (*)    All other components within normal limits  URINALYSIS, ROUTINE W REFLEX MICROSCOPIC - Abnormal; Notable for the following components:   Ketones, ur 5 (*)    All other components within normal limits  LIPASE, BLOOD  CBC  I-STAT BETA HCG BLOOD, ED ([facility], [facility], [facility] ONLY)    EKG None  Radiology CT ABDOMEN PELVIS W CONTRAST  Result Date: 04/19/2021 CLINICAL DATA:  Supraumbilical abdominal pain concern for hernia. EXAM: CT ABDOMEN AND PELVIS WITH CONTRAST TECHNIQUE: Multidetector CT imaging of the abdomen and pelvis was performed using the standard protocol following bolus administration of intravenous contrast. CONTRAST:  90mL OMNIPAQUE IOHEXOL 350 MG/ML SOLN COMPARISON:  None. FINDINGS: Lower chest: No acute abnormality. Hepatobiliary: No suspicious hepatic lesion. Gallbladder is unremarkable. No biliary ductal dilation. Pancreas: No pancreatic ductal dilation or evidence of acute inflammation. Spleen: Within normal limits. Adrenals/Urinary Tract: Adrenal glands are unremarkable. Kidneys are normal, without renal calculi, solid enhancing lesion, or hydronephrosis. Bladder is unremarkable. Stomach/Bowel: Stomach is unremarkable for degree of distension. No pathologic dilation of small or large bowel. The appendix and terminal ileum appear normal. No evidence of acute bowel inflammation. Vascular/Lymphatic: No significant vascular findings are present. No enlarged abdominal or pelvic lymph nodes. Reproductive: Uterus and bilateral adnexa are unremarkable. 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 Musculoskeletal: No acute or significant osseous findings. 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 Electronically Signed   By: Maudry Mayhew M.D.   On: 04/19/2021 15:08    Procedures Procedures   Medications Ordered in ED Medications  sodium chloride 0.9 % bolus 1,000 mL (1,000 mLs Intravenous New Bag/Given 04/19/21 1311)  iohexol (OMNIPAQUE) 350 MG/ML injection 80 mL (80 mLs Intravenous Contrast Given 04/19/21  1441)    ED Course  I have reviewed the triage vital signs and the nursing notes.  Pertinent labs & imaging results that were available during my care of the patient were reviewed by me and considered in my medical decision making (see chart for details).    MDM Rules/Calculators/A&P                         Additional history obtained from: Nursing notes from this visit. Review of electronic medical records. ---------------- 25 year old female presented for evaluation of periumbilical abdominal pain she is concerned for a hernia.  Patient reports that she was sent in today for a CT scan to assess for a hernia.  On my evaluation I am unable to palpate a periumbilical hernia but she is tender on exam.  She denies any nausea or vomiting last bowel movement was yesterday and was normal.  She does report a small amount of nonbloody diarrhea over the past few days as well.  I discussed risk versus benefits of CT imaging with the patient, patient stated understanding and wished to proceed with CT scan to evaluate for a cause of her pain. ----------- I ordered, reviewed and interpreted labs which include: CBC without leukocytosis, anemia or thrombocytopenia. Pregnancy test negative. Lipase within  normal limits. CMP shows no emergent electrolyte derangement, AKI or gap.  Total bilirubin elevated at 1.9 other LFTs within normal limits. --- Patient reassessed resting comfortably in bed no acute distress using phone.  She denied need for pain medication at this time.  CT scan pending.  Care handoff given to Berle Mull PA-C at shift change.  Plan of care is to follow-up on CT and final disposition as per oncoming team. ------------------------ Addendum:  CTAP:  IMPRESSION:  Area of inflammation with central lucency in the upper abdominal  omentum, most consistent with a benign self-limiting omental fat  infarct which can be a cause of abdominal pain but requires no  intervention or follow-up.   Patient reassessed resting comfortably in bed no acute distress she was updated on CT findings above and stated understanding.  Patient reported pain well controlled with Tylenol at home and she does not require any additional pain medication.  I asked the patient to call her primary care provider to inform them of findings above and to schedule follow-up appointment.  Note: Portions of this report may have been transcribed using voice recognition software. Every effort was made to ensure accuracy; however, inadvertent computerized transcription errors may still be present.  Final Clinical Impression(s) / ED Diagnoses Final diagnoses:  Generalized abdominal pain    Rx / DC Orders ED Discharge Orders     None        Bill Salinas, PA-C 04/19/21 1508    Bill Salinas, PA-C 04/19/21 1527    Sloan Leiter, DO 04/20/21 0915

## 2021-04-19 NOTE — ED Notes (Signed)
Pt currently in consultation A pumping.

## 2021-04-19 NOTE — ED Notes (Signed)
Pt provided discharge instructions and prescription information. Pt was given the opportunity to ask questions and questions were answered. Discharge signature not obtained in the setting of the COVID-19 pandemic in order to reduce high touch surfaces.  ° °

## 2021-04-19 NOTE — Discharge Instructions (Addendum)
At this time there does not appear to be the presence of an emergent medical condition, however there is always the potential for conditions to change. Please read and follow the below instructions.  Please return to the Emergency Department immediately for any new or worsening symptoms or if your symptoms do not improve within 3 days. Please be sure to follow up with your Primary Care Provider within one week regarding your visit today; please call their office to schedule an appointment even if you are feeling better for a follow-up visit. Your CT scan today showed an area of inflammation along your omentum.  This is likely the cause of your pain.  Please call your primary care provider today to schedule a follow-up appointment.  Go to the nearest Emergency Department immediately if: You have fever or chills You cannot stop vomiting. Your pain is only in areas of your belly, such as the right side or the left lower part of the belly. You have bloody or black poop, or poop that looks like tar. You have very bad pain, cramping, or bloating in your belly. You have signs of not having enough fluid or water in your body (dehydration), such as: Dark pee, very little pee, or no pee. Cracked lips. Dry mouth. Sunken eyes. Sleepiness. Weakness. You have trouble breathing or chest pain. You have any new/concerning or worsening of symptoms   Please read the additional information packets attached to your discharge summary.  Do not take your medicine if  develop an itchy rash, swelling in your mouth or lips, or difficulty breathing; call 911 and seek immediate emergency medical attention if this occurs.  You may review your lab tests and imaging results in their entirety on your MyChart account.  Please discuss all results of fully with your primary care provider and other specialist at your follow-up visit.  Note: Portions of this text may have been transcribed using voice recognition software.  Every effort was made to ensure accuracy; however, inadvertent computerized transcription errors may still be present.

## 2021-04-19 NOTE — ED Triage Notes (Signed)
Pt. Stated, I was borned with an unbiblical hernia and its hurting. I went to UC in [place] and they said I needed a scan.

## 2021-11-30 LAB — OB RESULTS CONSOLE HEPATITIS B SURFACE ANTIGEN: Hepatitis B Surface Ag: NEGATIVE

## 2021-11-30 LAB — OB RESULTS CONSOLE ABO/RH: RH Type: POSITIVE

## 2021-11-30 LAB — HEPATITIS C ANTIBODY: HCV Ab: NEGATIVE

## 2021-11-30 LAB — OB RESULTS CONSOLE HIV ANTIBODY (ROUTINE TESTING): HIV: NONREACTIVE

## 2021-11-30 LAB — OB RESULTS CONSOLE RUBELLA ANTIBODY, IGM: Rubella: IMMUNE

## 2021-11-30 LAB — OB RESULTS CONSOLE ANTIBODY SCREEN: Antibody Screen: NEGATIVE

## 2021-11-30 LAB — OB RESULTS CONSOLE RPR: RPR: NONREACTIVE

## 2021-12-22 LAB — OB RESULTS CONSOLE GC/CHLAMYDIA: Chlamydia: NEGATIVE

## 2022-06-13 LAB — OB RESULTS CONSOLE GBS: GBS: NEGATIVE

## 2022-06-27 ENCOUNTER — Encounter (HOSPITAL_COMMUNITY): Payer: Self-pay | Admitting: *Deleted

## 2022-06-27 ENCOUNTER — Telehealth (HOSPITAL_COMMUNITY): Payer: Self-pay | Admitting: *Deleted

## 2022-06-27 NOTE — Telephone Encounter (Signed)
Preadmission screen  

## 2022-06-28 ENCOUNTER — Encounter (HOSPITAL_COMMUNITY): Payer: Self-pay | Admitting: *Deleted

## 2022-07-04 NOTE — H&P (Signed)
Kristi Brewer is a 27 y.o. female presenting for IOL. Pregnancy complicated by U8HFG on glybuirde 2.5mg  po QHS with good control. Also Hx of PIH with first pregnancy and Hx of migraine HA. OB History     Gravida  2   Para  1   Term  1   Preterm  0   AB  0   Living  1      SAB  0   IAB  0   Ectopic  0   Multiple  0   Live Births  1          Past Medical History:  Diagnosis Date   Gestational diabetes    Hernia of abdominal cavity    Past Surgical History:  Procedure Laterality Date   ADENOIDECTOMY     TYMPANOSTOMY TUBE PLACEMENT     Family History: family history includes Diabetes in her mother. Social History:  reports that she has never smoked. She has never used smokeless tobacco. She reports that she does not currently use alcohol after a past usage of about 2.0 standard drinks of alcohol per week. She reports that she does not use drugs.     Maternal Diabetes: Yes:  Diabetes Type:  Insulin/Medication controlled Genetic Screening: Normal Maternal Ultrasounds/Referrals: Normal Fetal Ultrasounds or other Referrals:  None Maternal Substance Abuse:  No Significant Maternal Medications:  Meds include: Other: glyburide Significant Maternal Lab Results:  Group B Strep negative Number of Prenatal Visits:greater than 3 verified prenatal visits Other Comments:  None  Review of Systems  Constitutional:  Negative for fever.  Eyes:  Negative for visual disturbance.  Gastrointestinal:  Negative for abdominal pain.  Neurological:  Negative for headaches.   Maternal Medical History:  Fetal activity: Perceived fetal activity is normal.       currently breastfeeding. Maternal Exam:  Abdomen: Fetal presentation: vertex   Physical Exam Cardiovascular:     Rate and Rhythm: Normal rate.  Pulmonary:     Effort: Pulmonary effort is normal.    Cx 1/20/-3 in office  Prenatal labs: ABO, Rh: A/Positive/-- (05/31 0000) Antibody: Negative (05/31  0000) Rubella: Immune (05/31 0000) RPR: Nonreactive (05/31 0000)  HBsAg: Negative (05/31 0000)  HIV: Non-reactive (05/31 0000)  GBS: Negative/-- (12/12 0000)   Assessment/Plan: 27 yo G2P1 @ 43 1/7 A2GDM Two stage IOL   Shon Millet II 07/04/2022, 5:41 PM

## 2022-07-06 ENCOUNTER — Other Ambulatory Visit: Payer: Self-pay

## 2022-07-06 ENCOUNTER — Inpatient Hospital Stay (HOSPITAL_COMMUNITY): Payer: BC Managed Care – PPO | Admitting: Anesthesiology

## 2022-07-06 ENCOUNTER — Inpatient Hospital Stay (HOSPITAL_COMMUNITY): Payer: BC Managed Care – PPO

## 2022-07-06 ENCOUNTER — Inpatient Hospital Stay (HOSPITAL_COMMUNITY)
Admission: RE | Admit: 2022-07-06 | Discharge: 2022-07-07 | DRG: 807 | Disposition: A | Discharge status: Home / Self Care | Payer: BC Managed Care – PPO | Attending: Obstetrics and Gynecology | Admitting: Obstetrics and Gynecology

## 2022-07-06 ENCOUNTER — Encounter (HOSPITAL_COMMUNITY): Payer: Self-pay | Admitting: Obstetrics and Gynecology

## 2022-07-06 DIAGNOSIS — Z3A39 39 weeks gestation of pregnancy: Secondary | ICD-10-CM

## 2022-07-06 DIAGNOSIS — O24419 Gestational diabetes mellitus in pregnancy, unspecified control: Principal | ICD-10-CM | POA: Diagnosis present

## 2022-07-06 DIAGNOSIS — O24425 Gestational diabetes mellitus in childbirth, controlled by oral hypoglycemic drugs: Principal | ICD-10-CM | POA: Diagnosis present

## 2022-07-06 LAB — COMPREHENSIVE METABOLIC PANEL
ALT: 17 U/L (ref 0–44)
AST: 25 U/L (ref 15–41)
Albumin: 2.8 g/dL — ABNORMAL LOW (ref 3.5–5.0)
Alkaline Phosphatase: 123 U/L (ref 38–126)
Anion gap: 8 (ref 5–15)
BUN: 8 mg/dL (ref 6–20)
CO2: 21 mmol/L — ABNORMAL LOW (ref 22–32)
Calcium: 8.8 mg/dL — ABNORMAL LOW (ref 8.9–10.3)
Chloride: 106 mmol/L (ref 98–111)
Creatinine, Ser: 0.69 mg/dL (ref 0.44–1.00)
GFR, Estimated: >60 mL/min (ref >60)
Glucose, Bld: 110 mg/dL — ABNORMAL HIGH (ref 70–99)
Potassium: 3.4 mmol/L — ABNORMAL LOW (ref 3.5–5.1)
Sodium: 135 mmol/L (ref 135–145)
Total Bilirubin: 1.1 mg/dL (ref 0.3–1.2)
Total Protein: 6 g/dL — ABNORMAL LOW (ref 6.5–8.1)

## 2022-07-06 LAB — CBC
HCT: 33.8 % — ABNORMAL LOW (ref 36.0–46.0)
HCT: 33 % — ABNORMAL LOW (ref 36.0–46.0)
Hemoglobin: 11.5 g/dL — ABNORMAL LOW (ref 12.0–15.0)
Hemoglobin: 10.9 g/dL — ABNORMAL LOW (ref 12.0–15.0)
MCH: 28.5 pg (ref 26.0–34.0)
MCH: 28 pg (ref 26.0–34.0)
MCHC: 34 g/dL (ref 30.0–36.0)
MCHC: 33 g/dL (ref 30.0–36.0)
MCV: 83.7 fL (ref 80.0–100.0)
MCV: 84.8 fL (ref 80.0–100.0)
Platelets: 211 10*3/uL (ref 150–400)
Platelets: 180 10*3/uL (ref 150–400)
RBC: 4.04 MIL/uL (ref 3.87–5.11)
RBC: 3.89 MIL/uL (ref 3.87–5.11)
RDW: 13.5 % (ref 11.5–15.5)
RDW: 13.5 % (ref 11.5–15.5)
WBC: 11.7 10*3/uL — ABNORMAL HIGH (ref 4.0–10.5)
WBC: 13.1 10*3/uL — ABNORMAL HIGH (ref 4.0–10.5)
nRBC: 0 % (ref 0.0–0.2)
nRBC: 0 % (ref 0.0–0.2)

## 2022-07-06 LAB — TYPE AND SCREEN
ABO/RH(D): A POS
Antibody Screen: NEGATIVE

## 2022-07-06 LAB — GLUCOSE, CAPILLARY: Glucose-Capillary: 85 mg/dL (ref 70–99)

## 2022-07-06 LAB — RPR: RPR Ser Ql: NONREACTIVE

## 2022-07-06 MED ORDER — FENTANYL-BUPIVACAINE-NACL 0.5-0.125-0.9 MG/250ML-% EP SOLN
12.0000 mL/h | EPIDURAL | Status: DC | PRN
  Filled 2022-07-06: qty 250

## 2022-07-06 MED ORDER — FLEET ENEMA 7-19 GM/118ML RE ENEM: 1.0000 | ENEMA | RECTAL | Status: DC | PRN

## 2022-07-06 MED ORDER — IBUPROFEN 600 MG PO TABS
600.0000 mg | ORAL_TABLET | Freq: Four times a day (QID) | ORAL | Status: DC
  Administered 2022-07-06 – 2022-07-07 (×4): 600 mg via ORAL
  Filled 2022-07-06 (×4): qty 1

## 2022-07-06 MED ORDER — FENTANYL CITRATE (PF) 100 MCG/2ML IJ SOLN: 50.0000 ug | INTRAMUSCULAR | Status: DC | PRN

## 2022-07-06 MED ORDER — LACTATED RINGERS IV SOLN
INTRAVENOUS | Status: DC
Start: 2022-07-06 — End: 2022-07-06

## 2022-07-06 MED ORDER — DIPHENHYDRAMINE HCL 50 MG/ML IJ SOLN: 12.5000 mg | INTRAMUSCULAR | Status: DC | PRN

## 2022-07-06 MED ORDER — ONDANSETRON HCL 4 MG/2ML IJ SOLN: 4.0000 mg | Freq: Four times a day (QID) | INTRAMUSCULAR | Status: DC | PRN

## 2022-07-06 MED ORDER — SIMETHICONE 80 MG PO CHEW: 80.0000 mg | CHEWABLE_TABLET | ORAL | Status: DC | PRN

## 2022-07-06 MED ORDER — OXYTOCIN BOLUS FROM INFUSION
333.0000 mL | Freq: Once | INTRAVENOUS | Status: AC
Start: 2022-07-06 — End: 2022-07-06
  Administered 2022-07-06: 333 mL via INTRAVENOUS

## 2022-07-06 MED ORDER — DIPHENHYDRAMINE HCL 25 MG PO CAPS: 25.0000 mg | ORAL_CAPSULE | Freq: Four times a day (QID) | ORAL | Status: DC | PRN

## 2022-07-06 MED ORDER — FENTANYL-BUPIVACAINE-NACL 0.5-0.125-0.9 MG/250ML-% EP SOLN
EPIDURAL | Status: DC | PRN
  Administered 2022-07-06: 12 mL/h via EPIDURAL

## 2022-07-06 MED ORDER — MISOPROSTOL 25 MCG QUARTER TABLET
25.0000 ug | ORAL_TABLET | Freq: Once | ORAL | Status: AC
  Administered 2022-07-06: 25 ug via VAGINAL
  Filled 2022-07-06: qty 1

## 2022-07-06 MED ORDER — HYDROXYZINE HCL 50 MG PO TABS: 50.0000 mg | ORAL_TABLET | Freq: Four times a day (QID) | ORAL | Status: DC | PRN

## 2022-07-06 MED ORDER — COCONUT OIL OIL: 1.0000 | TOPICAL_OIL | Status: DC | PRN

## 2022-07-06 MED ORDER — DIBUCAINE (PERIANAL) 1 % EX OINT: 1.0000 | TOPICAL_OINTMENT | CUTANEOUS | Status: DC | PRN

## 2022-07-06 MED ORDER — SOD CITRATE-CITRIC ACID 500-334 MG/5ML PO SOLN: 30.0000 mL | ORAL | Status: DC | PRN

## 2022-07-06 MED ORDER — OXYCODONE-ACETAMINOPHEN 5-325 MG PO TABS: 2.0000 | ORAL_TABLET | ORAL | Status: DC | PRN

## 2022-07-06 MED ORDER — ACETAMINOPHEN 325 MG PO TABS: 650.0000 mg | ORAL_TABLET | ORAL | Status: DC | PRN

## 2022-07-06 MED ORDER — ONDANSETRON HCL 4 MG/2ML IJ SOLN: 4.0000 mg | INTRAMUSCULAR | Status: DC | PRN

## 2022-07-06 MED ORDER — MISOPROSTOL 25 MCG QUARTER TABLET
25.0000 ug | ORAL_TABLET | ORAL | Status: DC
  Administered 2022-07-06: 25 ug via ORAL
  Filled 2022-07-06: qty 1

## 2022-07-06 MED ORDER — EPHEDRINE 5 MG/ML INJ: 10.0000 mg | INTRAVENOUS | Status: DC | PRN

## 2022-07-06 MED ORDER — SENNOSIDES-DOCUSATE SODIUM 8.6-50 MG PO TABS
2.0000 | ORAL_TABLET | ORAL | Status: DC
  Administered 2022-07-07: 2 via ORAL
  Filled 2022-07-06: qty 2

## 2022-07-06 MED ORDER — OXYCODONE-ACETAMINOPHEN 5-325 MG PO TABS: 1.0000 | ORAL_TABLET | ORAL | Status: DC | PRN

## 2022-07-06 MED ORDER — PHENYLEPHRINE 80 MCG/ML (10ML) SYRINGE FOR IV PUSH (FOR BLOOD PRESSURE SUPPORT): 80.0000 ug | PREFILLED_SYRINGE | INTRAVENOUS | Status: DC | PRN

## 2022-07-06 MED ORDER — LIDOCAINE HCL (PF) 1 % IJ SOLN
INTRAMUSCULAR | Status: DC | PRN
  Administered 2022-07-06: 2 mL via EPIDURAL
  Administered 2022-07-06: 10 mL via EPIDURAL

## 2022-07-06 MED ORDER — PRENATAL MULTIVITAMIN CH
1.0000 | ORAL_TABLET | Freq: Every day | ORAL | Status: DC
  Administered 2022-07-07: 1 via ORAL
  Filled 2022-07-06: qty 1

## 2022-07-06 MED ORDER — TERBUTALINE SULFATE 1 MG/ML IJ SOLN: 0.2500 mg | Freq: Once | INTRAMUSCULAR | Status: DC | PRN

## 2022-07-06 MED ORDER — LACTATED RINGERS IV SOLN: 500.0000 mL | INTRAVENOUS | Status: DC | PRN

## 2022-07-06 MED ORDER — OXYTOCIN-SODIUM CHLORIDE 30-0.9 UT/500ML-% IV SOLN
2.5000 [IU]/h | INTRAVENOUS | Status: DC
Start: 2022-07-06 — End: 2022-07-06
  Administered 2022-07-06: 2.5 [IU]/h via INTRAVENOUS
  Filled 2022-07-06: qty 500

## 2022-07-06 MED ORDER — OXYCODONE HCL 5 MG PO TABS: 5.0000 mg | ORAL_TABLET | ORAL | Status: DC | PRN

## 2022-07-06 MED ORDER — ZOLPIDEM TARTRATE 5 MG PO TABS: 5.0000 mg | ORAL_TABLET | Freq: Every evening | ORAL | Status: DC | PRN

## 2022-07-06 MED ORDER — BENZOCAINE-MENTHOL 20-0.5 % EX AERO
1.0000 | INHALATION_SPRAY | CUTANEOUS | Status: DC | PRN
  Administered 2022-07-06: 1 via TOPICAL
  Filled 2022-07-06: qty 56

## 2022-07-06 MED ORDER — LIDOCAINE HCL (PF) 1 % IJ SOLN: 30.0000 mL | INTRAMUSCULAR | Status: DC | PRN

## 2022-07-06 MED ORDER — WITCH HAZEL-GLYCERIN EX PADS
1.0000 | MEDICATED_PAD | CUTANEOUS | Status: DC | PRN
  Administered 2022-07-06: 1 via TOPICAL

## 2022-07-06 MED ORDER — TETANUS-DIPHTH-ACELL PERTUSSIS 5-2.5-18.5 LF-MCG/0.5 IM SUSY: 0.5000 mL | PREFILLED_SYRINGE | Freq: Once | INTRAMUSCULAR | Status: DC

## 2022-07-06 MED ORDER — OXYCODONE HCL 5 MG PO TABS: 10.0000 mg | ORAL_TABLET | ORAL | Status: DC | PRN

## 2022-07-06 MED ORDER — LACTATED RINGERS IV SOLN
500.0000 mL | Freq: Once | INTRAVENOUS | Status: AC
  Administered 2022-07-06: 500 mL via INTRAVENOUS

## 2022-07-06 MED ORDER — ONDANSETRON HCL 4 MG PO TABS: 4.0000 mg | ORAL_TABLET | ORAL | Status: DC | PRN

## 2022-07-06 NOTE — Progress Notes (Signed)
Delivery Note At 12:19 PM a viable female was delivered via Vaginal, Spontaneous (Presentation: Right Occiput Anterior).  APGAR: 9, 9; weight  .   Placenta status: Spontaneous, Intact.  Cord: 3 vessels with the following complications: None.  Cord pH: pending  Anesthesia: Epidural Episiotomy: second degree MLE repaired Lacerations:  Suture Repair: 2.0 vicryl rapide Est. Blood Loss (mL): 150  Mom to postpartum.  Baby to Couplet care / Skin to Skin.  Shon Millet II 07/06/2022, 12:40 PM

## 2022-07-06 NOTE — Lactation Note (Signed)
This note was copied from a baby's chart. Lactation Consultation Note  Patient Name: Kristi Brewer Date: 07/06/2022 Reason for consult: Initial assessment;Term;Breastfeeding assistance Age:29 hours  LC entered the room and the infant was in the bassinet.  Per the birth parent things have been going well with breastfeeding.  The birth parent stated that the infant has latched well since birth.  LC reviewed the lactation outpatient services brochure.  The birth parent is an experienced breastfeeding parent and does not wish to be seen by lactation.  The parents stated that they did not have a positive experience with lactation last time and would prefer not to be seen.  The parents commented that they told the RN that they preferred not to be seen by lactation.  LC let the parents know that she was not informed.  LC apologized to the parents, congratulated them, and exited the room.   Maternal Data Has patient been taught Hand Expression?: Yes Does the patient have breastfeeding experience prior to this delivery?: Yes How long did the patient breastfeed?: 8 months  Feeding Mother's Current Feeding Choice: Breast Milk  LATCH Score Latch: Grasps breast easily, tongue down, lips flanged, rhythmical sucking.  Audible Swallowing: Spontaneous and intermittent  Type of Nipple: Everted at rest and after stimulation  Comfort (Breast/Nipple): Soft / non-tender  Hold (Positioning): No assistance needed to correctly position infant at breast.  LATCH Score: 10  Interventions Interventions: [facility] brochure  Discharge Pump: DEBP;Personal  Consult Status Consult Status: Complete (mother declined follow up)   Lysbeth Penner 07/06/2022, 4:15 PM

## 2022-07-06 NOTE — Progress Notes (Signed)
Epidural in>comfortable FHT cat one UCs q2-3 min Cx 4/70/-2/Vtx AROM> clear  A/P: D/W patient above         All questions answered

## 2022-07-06 NOTE — Anesthesia Preprocedure Evaluation (Signed)
Anesthesia Evaluation  Patient identified by MRN, date of birth, ID band Patient awake    Reviewed: Allergy & Precautions, Patient's Chart, lab work & pertinent test results  Airway Mallampati: II  TM Distance: >3 FB Neck ROM: Full    Dental no notable dental hx.    Pulmonary neg pulmonary ROS   Pulmonary exam normal breath sounds clear to auscultation       Cardiovascular hypertension, Normal cardiovascular exam Rhythm:Regular Rate:Normal     Neuro/Psych negative neurological ROS  negative psych ROS   GI/Hepatic negative GI ROS, Neg liver ROS,,,  Endo/Other  diabetes, Well Controlled, Gestational    Renal/GU negative Renal ROS  negative genitourinary   Musculoskeletal negative musculoskeletal ROS (+)    Abdominal   Peds negative pediatric ROS (+)  Hematology  (+) Blood dyscrasia, anemia Hb 11.5, plt 211   Anesthesia Other Findings   Reproductive/Obstetrics (+) Pregnancy                             Anesthesia Physical Anesthesia Plan  ASA: 2  Anesthesia Plan: Epidural   Post-op Pain Management:    Induction:   PONV Risk Score and Plan: 2  Airway Management Planned: Natural Airway  Additional Equipment: None  Intra-op Plan:   Post-operative Plan:   Informed Consent: I have reviewed the patients History and Physical, chart, labs and discussed the procedure including the risks, benefits and alternatives for the proposed anesthesia with the patient or authorized representative who has indicated his/her understanding and acceptance.       Plan Discussed with:   Anesthesia Plan Comments:        Anesthesia Quick Evaluation

## 2022-07-06 NOTE — Anesthesia Procedure Notes (Signed)
Epidural Patient location during procedure: OB Start time: 07/06/2022 6:11 AM End time: 07/06/2022 6:19 AM  Staffing Anesthesiologist: Pervis Hocking, DO Performed: anesthesiologist   Preanesthetic Checklist Completed: patient identified, IV checked, risks and benefits discussed, monitors and equipment checked, pre-op evaluation and timeout performed  Epidural Patient position: sitting Prep: DuraPrep and site prepped and draped Patient monitoring: continuous pulse ox, blood pressure, heart rate and cardiac monitor Approach: midline Location: L3-L4 Injection technique: LOR air  Needle:  Needle type: Tuohy  Needle gauge: 17 G Needle length: 9 cm Needle insertion depth: 6 cm Catheter type: closed end flexible Catheter size: 19 Gauge Catheter at skin depth: 11 cm Test dose: negative  Assessment Sensory level: T8 Events: blood not aspirated, no cerebrospinal fluid, injection not painful, no injection resistance, no paresthesia and negative IV test  Additional Notes Patient identified. Risks/Benefits/Options discussed with patient including but not limited to bleeding, infection, nerve damage, paralysis, failed block, incomplete pain control, headache, blood pressure changes, nausea, vomiting, reactions to medication both or allergic, itching and postpartum back pain. Confirmed with bedside nurse the patient's most recent platelet count. Confirmed with patient that they are not currently taking any anticoagulation, have any bleeding history or any family history of bleeding disorders. Patient expressed understanding and wished to proceed. All questions were answered. Sterile technique was used throughout the entire procedure. Please see nursing notes for vital signs. Test dose was given through epidural catheter and negative prior to continuing to dose epidural or start infusion. Warning signs of high block given to the patient including shortness of breath, tingling/numbness in  hands, complete motor block, or any concerning symptoms with instructions to call for help. Patient was given instructions on fall risk and not to get out of bed. All questions and concerns addressed with instructions to call with any issues or inadequate analgesia.  Reason for block:procedure for pain

## 2022-07-07 LAB — CBC
HCT: 31.8 % — ABNORMAL LOW (ref 36.0–46.0)
Hemoglobin: 10.4 g/dL — ABNORMAL LOW (ref 12.0–15.0)
MCH: 28.4 pg (ref 26.0–34.0)
MCHC: 32.7 g/dL (ref 30.0–36.0)
MCV: 86.9 fL (ref 80.0–100.0)
Platelets: 175 10*3/uL (ref 150–400)
RBC: 3.66 MIL/uL — ABNORMAL LOW (ref 3.87–5.11)
RDW: 13.7 % (ref 11.5–15.5)
WBC: 9.6 10*3/uL (ref 4.0–10.5)
nRBC: 0 % (ref 0.0–0.2)

## 2022-07-07 MED ORDER — SENNOSIDES-DOCUSATE SODIUM 8.6-50 MG PO TABS: 2.0000 | ORAL_TABLET | ORAL | 1 refills | Status: AC

## 2022-07-07 MED ORDER — IBUPROFEN 600 MG PO TABS: 600.0000 mg | ORAL_TABLET | Freq: Four times a day (QID) | ORAL | 0 refills | Status: AC

## 2022-07-07 NOTE — Anesthesia Postprocedure Evaluation (Signed)
Anesthesia Post Note  Patient: Kristi Brewer  Procedure(s) Performed: AN AD HOC LABOR EPIDURAL     Patient location during evaluation: Mother Baby Anesthesia Type: Epidural Level of consciousness: awake, oriented and awake and alert Pain management: pain level controlled Vital Signs Assessment: post-procedure vital signs reviewed and stable Respiratory status: spontaneous breathing, respiratory function stable and nonlabored ventilation Cardiovascular status: stable Postop Assessment: no headache, adequate PO intake, able to ambulate, patient able to bend at knees and no apparent nausea or vomiting Anesthetic complications: no   No notable events documented.  Last Vitals:  Vitals:   07/07/22 0135 07/07/22 0515  BP: 101/60 104/65  Pulse: 69 60  Resp: 18 18  Temp: 36.6 C 36.5 C  SpO2:      Last Pain:  Vitals:   07/07/22 0722  TempSrc:   PainSc: 3    Pain Goal:                   [name]

## 2022-07-07 NOTE — Discharge Summary (Signed)
Postpartum Discharge Summary  Date of Service updated 07/07/2022     Patient Name: Kristi Brewer DOB: 06/19/1996 MRN: [id number]  Date of admission: 07/06/2022 Delivery date:07/06/2022  Delivering provider: Everlene Farrier  Date of discharge: 07/07/2022  Admitting diagnosis: Gestational diabetes [O24.419] Intrauterine pregnancy: [redacted]w[redacted]d     Secondary diagnosis:  Principal Problem:   Gestational diabetes  Additional problems: None    Discharge diagnosis: GDM A2                                              Post partum procedures: None Augmentation: AROM and Cytotec Complications: None  Hospital course: Induction of Labor With Vaginal Delivery   27 y.o. yo G2P2002 at [redacted]w[redacted]d was admitted to the hospital 07/06/2022 for induction of labor.  Indication for induction: A2 DM.  Patient had an labor course that was uncomplicated. Membrane Rupture Time/Date: 8:59 AM ,07/06/2022   Delivery Method:Vaginal, Spontaneous  Episiotomy: Median  Lacerations:  2nd degree  Details of delivery can be found in separate delivery note.  Patient had a postpartum course that was complicated. Patient is discharged home 07/07/22.  Newborn Data: Birth date:07/06/2022  Birth time:12:19 PM  Gender:Female  Living status:Living  Apgars:9 ,9  Weight:3740 g   Magnesium Sulfate received: No BMZ received: No Rhophylac:No MMR:No T-DaP:Given prenatally Flu: No Transfusion:No  Physical exam  Vitals:   07/06/22 1737 07/06/22 2135 07/07/22 0135 07/07/22 0515  BP: 116/80 102/72 101/60 104/65  Pulse: 75 66 69 60  Resp: 18 18 18 18   Temp: 98.1 F (36.7 C) 98 F (36.7 C) 97.9 F (36.6 C) 97.7 F (36.5 C)  TempSrc: Oral Oral Oral Oral  SpO2:      Weight:      Height:       General: alert, cooperative, and no distress Lochia: appropriate Uterine Fundus: firm Incision: N/A DVT Evaluation: No evidence of DVT seen on physical exam. Labs: Lab Results  Component Value Date   WBC 9.6 07/07/2022   HGB 10.4 (L)  07/07/2022   HCT 31.8 (L) 07/07/2022   MCV 86.9 07/07/2022   PLT 175 07/07/2022      Latest Ref Rng & Units 07/06/2022   12:44 AM  CMP  Glucose 70 - 99 mg/dL 110   BUN 6 - 20 mg/dL 8   Creatinine 0.44 - 1.00 mg/dL 0.69   Sodium 135 - 145 mmol/L 135   Potassium 3.5 - 5.1 mmol/L 3.4   Chloride 98 - 111 mmol/L 106   CO2 22 - 32 mmol/L 21   Calcium 8.9 - 10.3 mg/dL 8.8   Total Protein 6.5 - 8.1 g/dL 6.0   Total Bilirubin 0.3 - 1.2 mg/dL 1.1   Alkaline Phos 38 - 126 U/L 123   AST 15 - 41 U/L 25   ALT 0 - 44 U/L 17    Edinburgh Score:    07/06/2022    5:45 PM  Edinburgh Postnatal Depression Scale Screening Tool  I have been able to laugh and see the funny side of things. 0  I have looked forward with enjoyment to things. 0  I have blamed myself unnecessarily when things went wrong. 0  I have been anxious or worried for no good reason. 0  I have felt scared or panicky for no good reason. 0  Things have been getting on top of me.  0  I have been so unhappy that I have had difficulty sleeping. 0  I have felt sad or miserable. 0  I have been so unhappy that I have been crying. 0  The thought of harming myself has occurred to me. 0  Edinburgh Postnatal Depression Scale Total 0      After visit meds:  Allergies as of 07/07/2022       Reactions   Pear Swelling        Medication List     STOP taking these medications    Incassia 0.35 MG tablet Generic drug: norethindrone       TAKE these medications    acetaminophen 500 MG tablet Commonly known as: TYLENOL Take 500 mg by mouth every 6 (six) hours as needed for mild pain.   ibuprofen 600 MG tablet Commonly known as: ADVIL Take 1 tablet (600 mg total) by mouth every 6 (six) hours.   prenatal multivitamin Tabs tablet Take 1 tablet by mouth daily at 12 noon.   senna-docusate 8.6-50 MG tablet Commonly known as: Senokot-S Take 2 tablets by mouth daily.         Discharge home in stable condition Infant  Feeding: Bottle and Breast Infant Disposition:home with mother Discharge instruction: per After Visit Summary and Postpartum booklet. Activity: Advance as tolerated. Pelvic rest for 6 weeks.  Diet: routine diet Anticipated Birth Control: Unsure Postpartum Appointment:6 weeks Additional Postpartum F/U:  None Future Appointments: Future Appointments  Date Time Provider [place]  07/12/2022 10:40 AM Berniece Salines, DO [facility] None    07/07/2022 Charlotta Newton, MD

## 2022-07-12 ENCOUNTER — Ambulatory Visit: Payer: BC Managed Care – PPO | Admitting: Cardiology

## 2022-07-13 ENCOUNTER — Telehealth (HOSPITAL_COMMUNITY): Payer: Self-pay

## 2022-07-13 NOTE — Telephone Encounter (Signed)
Patient did not answer phone call. Voicemail left for patient.   Sharyn Lull [facility] 07/13/22,1854

## 2022-07-14 LAB — SURGICAL PATHOLOGY

## 2022-07-19 ENCOUNTER — Encounter: Payer: Self-pay | Admitting: *Deleted

## 2022-10-09 IMAGING — CT CT ABD-PELV W/ CM
2 of 4 series · 17 of 46 positions shown, 19 images · IV contrast (omnipaque)
Comparison: None.

CLINICAL DATA: Supraumbilical abdominal pain concern for hernia.

EXAM:
CT ABDOMEN AND PELVIS WITH CONTRAST
TECHNIQUE: Multidetector CT imaging of the abdomen and pelvis was performed
using the standard protocol following bolus administration of
intravenous contrast.
CONTRAST:  80mL OMNIPAQUE IOHEXOL 350 MG/ML SOLN

[Series 3: abd/ pelvis 5.0 i30f 2 · axial · 0.89mm/px · z∈[+778,+1238]mm · 14 of 100 slices shown, 16 images]
[im 4/100  soft-tissue]
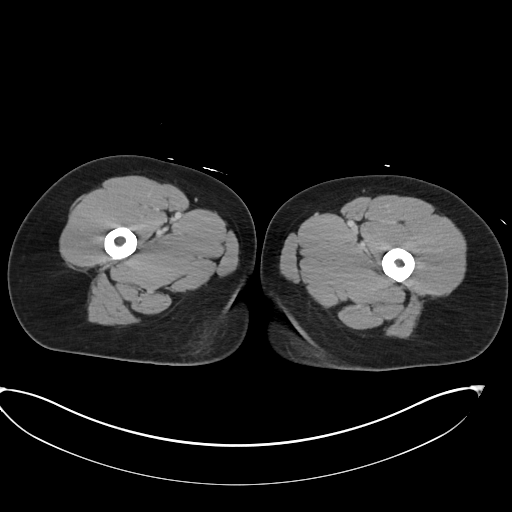
[im 4/100  bone]
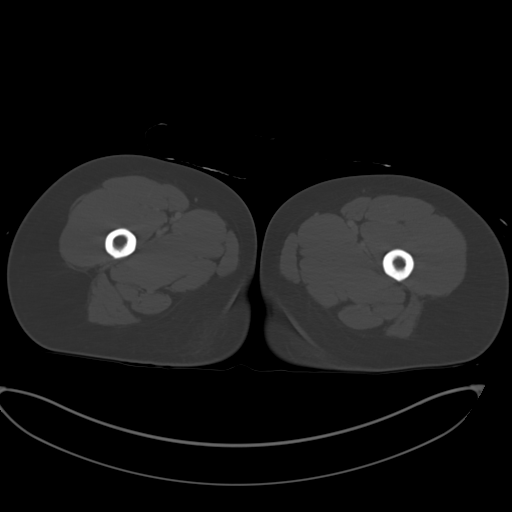
[im 12/100  soft-tissue]
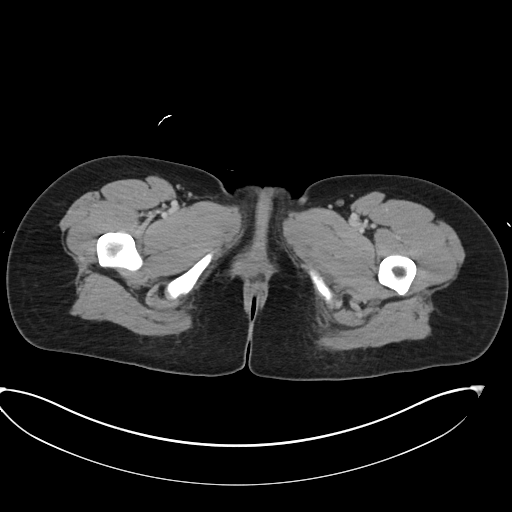
[im 20/100  soft-tissue]
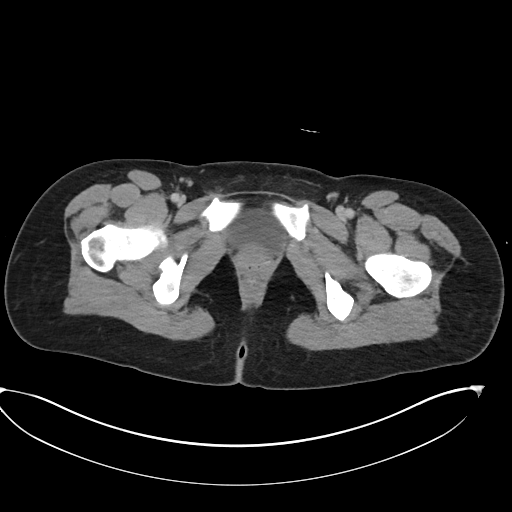
[im 27/100  soft-tissue]
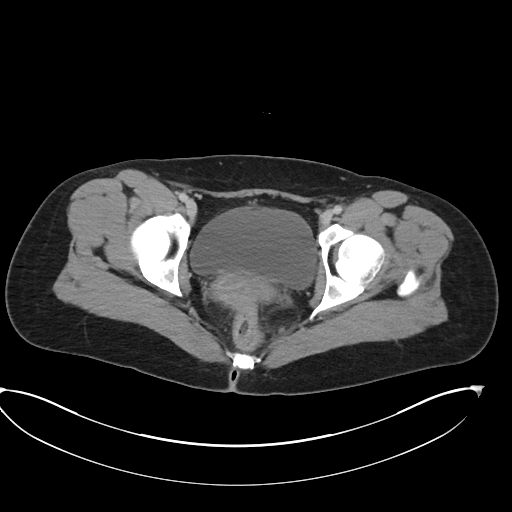
[im 35/100  soft-tissue]
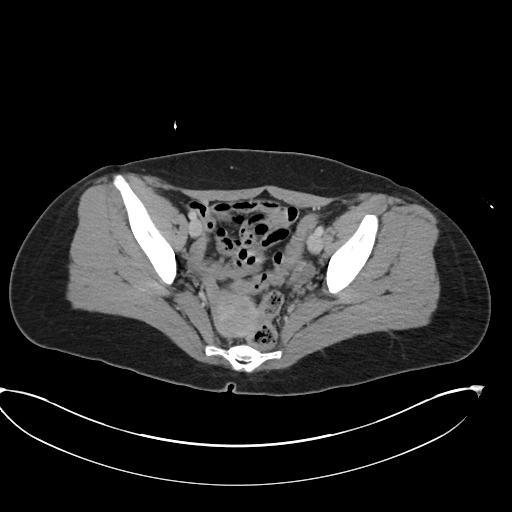
[im 39/100  soft-tissue]
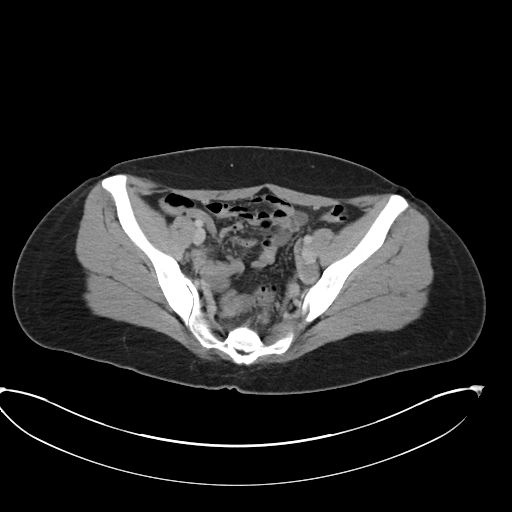
[im 46/100  soft-tissue]
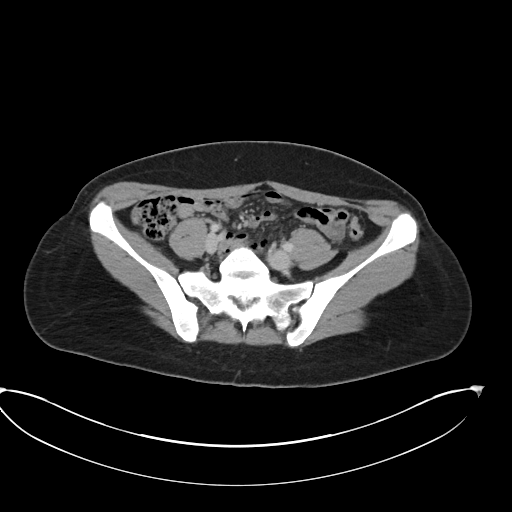
[im 54/100  soft-tissue]
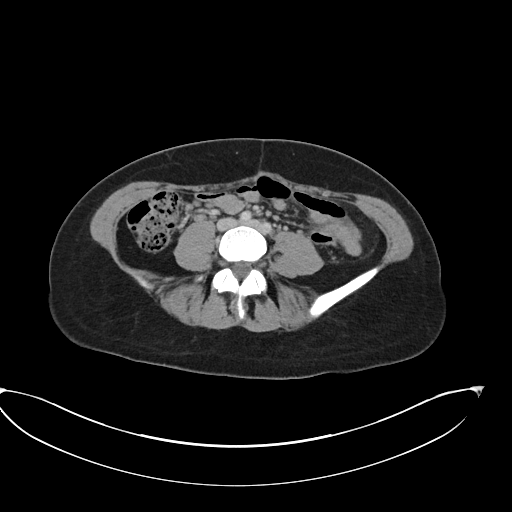
[im 61/100  soft-tissue]
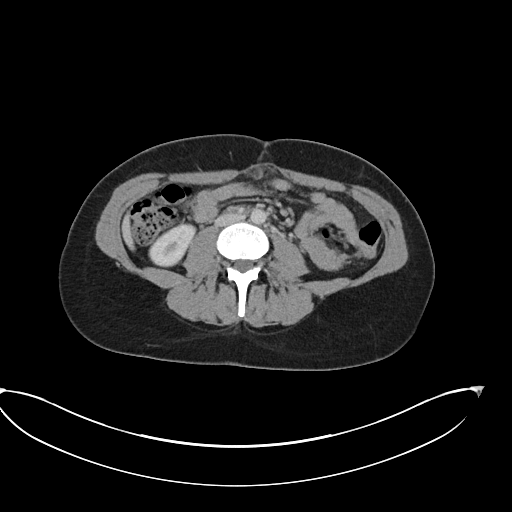
[im 61/100  bone]
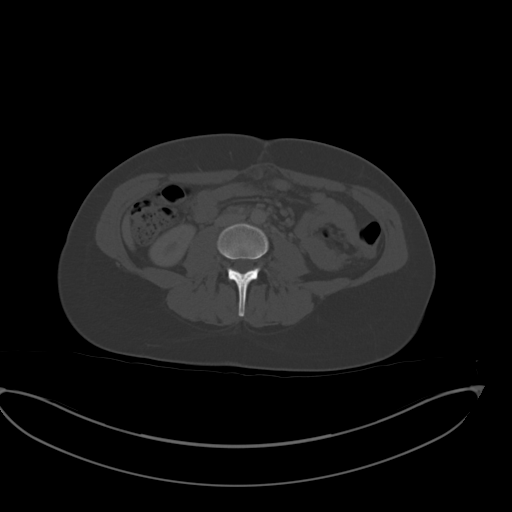
[im 65/100  soft-tissue]
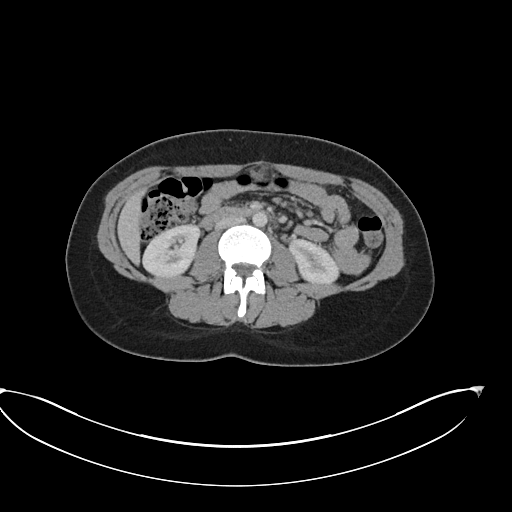
[im 73/100  soft-tissue]
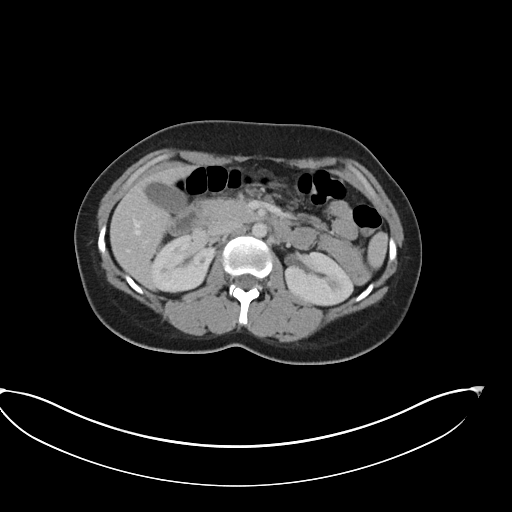
[im 80/100  soft-tissue]
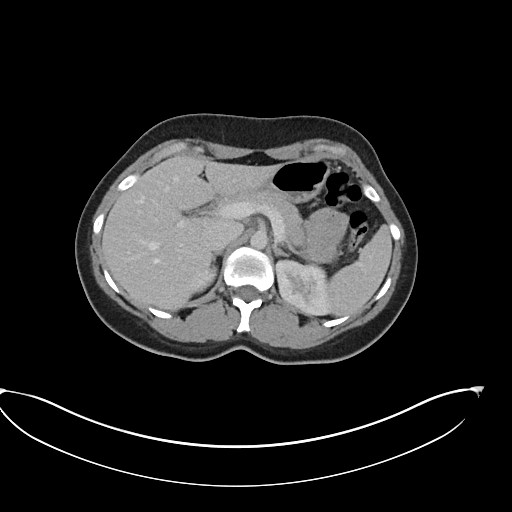
[im 88/100  soft-tissue]
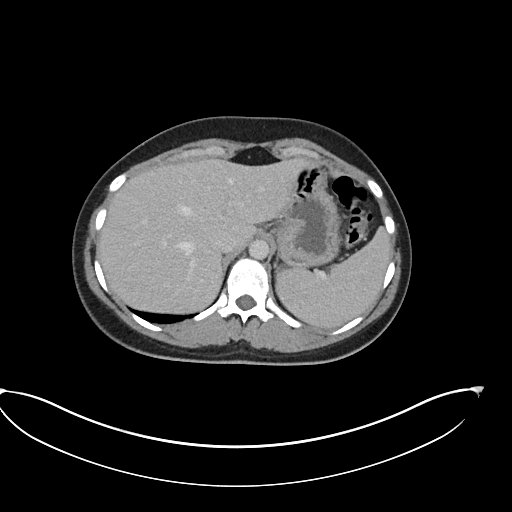
[im 96/100  soft-tissue]
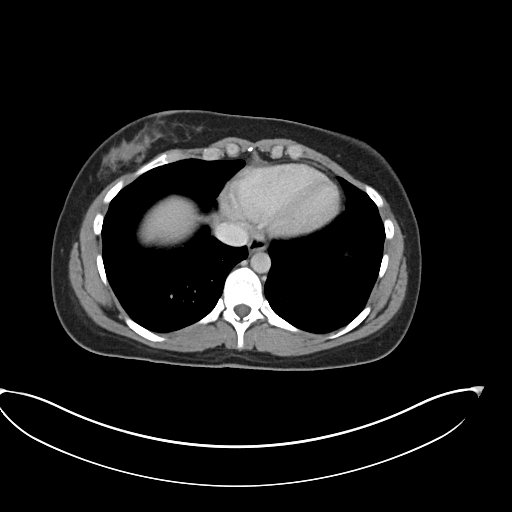

[Series 6: coronal soft tissue · coronal · 0.87mm/px · 3 of 101 slices shown]
[im 34/101  soft-tissue]
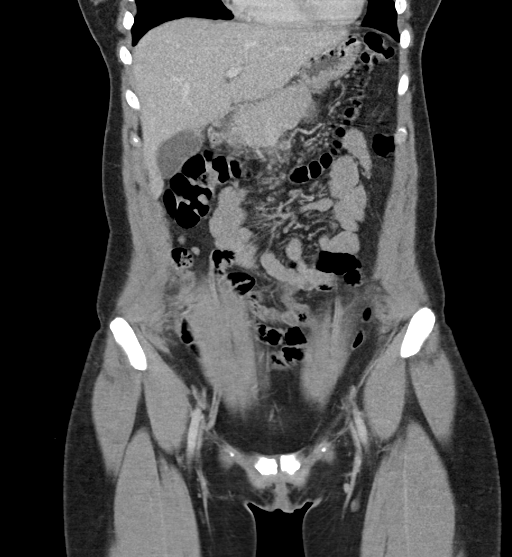
[im 45/101  soft-tissue]
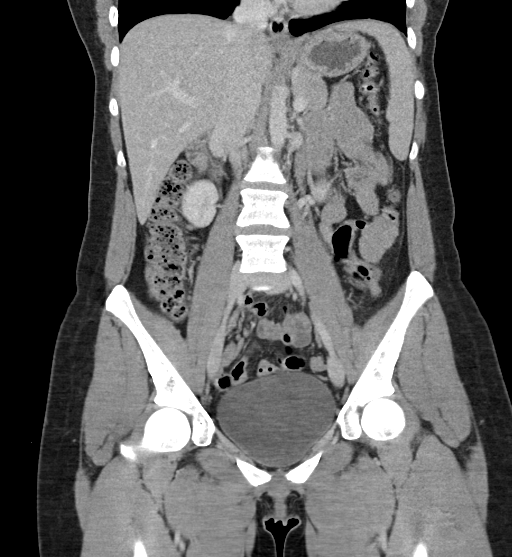
[im 56/101  soft-tissue]
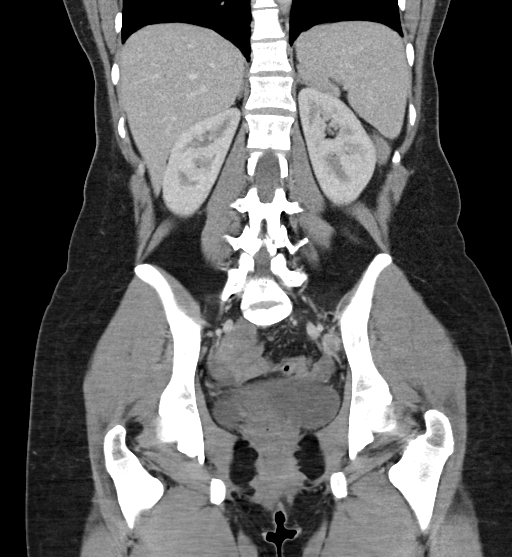

[17 of 46 positions shown; findings below may reference images not displayed]

FINDINGS: Lower chest: No acute abnormality.

Hepatobiliary: No suspicious hepatic lesion. Gallbladder is
unremarkable. No biliary ductal dilation.

Pancreas: No pancreatic ductal dilation or evidence of acute
inflammation.

Spleen: Within normal limits.

Adrenals/Urinary Tract: Adrenal glands are unremarkable. Kidneys are
normal, without renal calculi, solid enhancing lesion, or
hydronephrosis. Bladder is unremarkable.

Stomach/Bowel: Stomach is unremarkable for degree of distension. No
pathologic dilation of small or large bowel. The appendix and
terminal ileum appear normal. No evidence of acute bowel
inflammation.

Vascular/Lymphatic: No significant vascular findings are present. No
enlarged abdominal or pelvic lymph nodes.

Reproductive: Uterus and bilateral adnexa are unremarkable.

Other: Area of inflammation with central lucency in the upper
abdominal omentum on image 38/3 or sagittal image 77, most
consistent with a benign self-limiting omental fat infarct which can
be a cause of abdominal pain but requires no intervention or
follow-up.

Musculoskeletal: No acute or significant osseous findings.
IMPRESSION: Area of inflammation with central lucency in the upper abdominal
omentum, most consistent with a benign self-limiting omental fat
infarct which can be a cause of abdominal pain but requires no
intervention or follow-up.
